# Patient Record
Sex: Male | Born: 1945 | Race: White | Hispanic: No | Marital: Married | Smoking: Former smoker
Health system: Southern US, Community
[De-identification: ages and names within clinical notes are randomized; demographics above are authoritative.]

## PROBLEM LIST (undated history)

## (undated) DIAGNOSIS — K269 Duodenal ulcer, unspecified as acute or chronic, without hemorrhage or perforation: Secondary | ICD-10-CM

## (undated) DIAGNOSIS — M1990 Unspecified osteoarthritis, unspecified site: Secondary | ICD-10-CM

## (undated) DIAGNOSIS — E785 Hyperlipidemia, unspecified: Secondary | ICD-10-CM

## (undated) DIAGNOSIS — M5030 Other cervical disc degeneration, unspecified cervical region: Secondary | ICD-10-CM

## (undated) DIAGNOSIS — Z87891 Personal history of nicotine dependence: Secondary | ICD-10-CM

## (undated) DIAGNOSIS — N400 Enlarged prostate without lower urinary tract symptoms: Secondary | ICD-10-CM

## (undated) DIAGNOSIS — L570 Actinic keratosis: Secondary | ICD-10-CM

## (undated) DIAGNOSIS — R011 Cardiac murmur, unspecified: Secondary | ICD-10-CM

## (undated) DIAGNOSIS — R012 Other cardiac sounds: Secondary | ICD-10-CM

## (undated) DIAGNOSIS — E119 Type 2 diabetes mellitus without complications: Secondary | ICD-10-CM

## (undated) HISTORY — PX: CARDIAC CATHETERIZATION: SHX172

## (undated) HISTORY — PX: OTHER SURGICAL HISTORY: SHX169

## (undated) HISTORY — DX: Personal history of nicotine dependence: Z87.891

## (undated) HISTORY — PX: PILONIDAL CYST EXCISION: SHX744

## (undated) HISTORY — DX: Actinic keratosis: L57.0

---

## 2005-03-09 ENCOUNTER — Ambulatory Visit: Payer: Self-pay | Admitting: Unknown Physician Specialty

## 2005-03-23 ENCOUNTER — Ambulatory Visit: Payer: Self-pay | Admitting: Unknown Physician Specialty

## 2005-04-01 ENCOUNTER — Ambulatory Visit: Payer: Self-pay | Admitting: Unknown Physician Specialty

## 2010-05-21 ENCOUNTER — Ambulatory Visit: Payer: Self-pay | Admitting: Orthopedic Surgery

## 2013-12-06 ENCOUNTER — Ambulatory Visit: Payer: Self-pay | Admitting: Internal Medicine

## 2013-12-06 LAB — CBC CANCER CENTER
BASOS PCT: 1.4 %
Basophil #: 0.1 x10 3/mm (ref 0.0–0.1)
EOS ABS: 0.3 x10 3/mm (ref 0.0–0.7)
EOS PCT: 4.6 %
HCT: 45 % (ref 40.0–52.0)
HGB: 14.6 g/dL (ref 13.0–18.0)
Lymphocyte #: 1.5 x10 3/mm (ref 1.0–3.6)
Lymphocyte %: 20.7 %
MCH: 29.5 pg (ref 26.0–34.0)
MCHC: 32.3 g/dL (ref 32.0–36.0)
MCV: 91 fL (ref 80–100)
MONO ABS: 0.7 x10 3/mm (ref 0.2–1.0)
MONOS PCT: 10.2 %
NEUTROS PCT: 63.1 %
Neutrophil #: 4.5 x10 3/mm (ref 1.4–6.5)
PLATELETS: 215 x10 3/mm (ref 150–440)
RBC: 4.94 10*6/uL (ref 4.40–5.90)
RDW: 13.6 % (ref 11.5–14.5)
WBC: 7.2 x10 3/mm (ref 3.8–10.6)

## 2013-12-06 LAB — IRON AND TIBC
IRON BIND. CAP.(TOTAL): 324 ug/dL (ref 250–450)
Iron Saturation: 27 %
Iron: 87 ug/dL (ref 65–175)
UNBOUND IRON-BIND. CAP.: 237 ug/dL

## 2013-12-06 LAB — FERRITIN: Ferritin (ARMC): 41 ng/mL (ref 8–388)

## 2013-12-12 LAB — CANCER CENTER HEMATOCRIT: HCT: 46.1 % (ref 40.0–52.0)

## 2013-12-28 ENCOUNTER — Ambulatory Visit: Payer: Self-pay | Admitting: Internal Medicine

## 2014-01-17 ENCOUNTER — Ambulatory Visit: Payer: Self-pay | Admitting: Family Medicine

## 2015-01-23 ENCOUNTER — Telehealth: Payer: Self-pay

## 2015-01-23 NOTE — Telephone Encounter (Signed)
Navigator Encounter Type: Telephone, Screening  Notified patient that annual lung cancer screening low dose CT scan is due. Confirmed that patient is within the age range 82-77 and asymptomatic. The patient quit smoking ten years ago. Shared Decision Making Visit was done on 01/17/2014. Patient is agreeable to scheduling of CT scan.

## 2015-02-09 ENCOUNTER — Encounter: Payer: Self-pay | Admitting: Family Medicine

## 2015-02-09 ENCOUNTER — Telehealth: Payer: Self-pay | Admitting: *Deleted

## 2015-02-09 ENCOUNTER — Other Ambulatory Visit: Payer: Self-pay | Admitting: Family Medicine

## 2015-02-09 DIAGNOSIS — Z87891 Personal history of nicotine dependence: Secondary | ICD-10-CM | POA: Insufficient documentation

## 2015-02-09 HISTORY — DX: Personal history of nicotine dependence: Z87.891

## 2015-02-09 NOTE — Telephone Encounter (Deleted)
Pt called and saw on my chart that he had ct screening order for 9/16 and would like to know why he is been sch. For it.  I have tried to get in touch with Marcie Bal who entered a note about this 8/26 and Shawn and so far I have found out that Marcie Bal has left for the day and Shawn not been able to be reached.  I did call pt and explain to him that because he either was a current smoker of many years or he was a smoker for many years.  He states that he quit 10 years ago and again I explained it that he meets the criteria for the screening.  He asked about risks of having scans year after year and I did tell him it is low dose of exposure and I told him that I will have one of the navaigators call him tom. To discuss it but he prefers to cancel it. He is willing to wait til call back tom.

## 2015-02-09 NOTE — Telephone Encounter (Signed)
Pt called and saw on my chart that he had ct screening order for 9/16 and would like to know why he is been sch. For it.  I have tried to get in touch with Marcie Bal who entered a note about this 8/26 and Shawn and so far I have found out that Marcie Bal has left for the day and Shawn not been able to be reached.  I did call pt and explain to him that because he either was a current smoker of many years or he was a smoker for many years.  He states that he quit 10 years ago and again I explained it that he meets the criteria for the screening.  He asked about risks of having scans year after year and I did tell him it is low dose of exposure and I told him that I will have one of the navaigators call him tom. To discuss it but he prefers to cancel it. He is willing to wait til call back tom.

## 2015-02-12 ENCOUNTER — Telehealth: Payer: Self-pay

## 2015-02-12 NOTE — Telephone Encounter (Signed)
Telephone call to patient 02/10/15. Patient stated he no longer wants to participate in CT screening.

## 2015-02-13 ENCOUNTER — Ambulatory Visit: Payer: Medicare Other

## 2017-02-01 ENCOUNTER — Encounter: Payer: Self-pay | Admitting: Anesthesiology

## 2017-02-01 ENCOUNTER — Ambulatory Visit
Admission: RE | Admit: 2017-02-01 | Discharge: 2017-02-01 | Disposition: A | Discharge status: Home / Self Care | Payer: Medicare Other | Source: Ambulatory Visit | Attending: Unknown Physician Specialty | Admitting: Unknown Physician Specialty

## 2017-02-01 ENCOUNTER — Ambulatory Visit: Payer: Medicare Other | Admitting: Anesthesiology

## 2017-02-01 ENCOUNTER — Encounter
Admission: RE | Disposition: A | Discharge status: Home / Self Care | Payer: Self-pay | Source: Ambulatory Visit | Attending: Unknown Physician Specialty

## 2017-02-01 DIAGNOSIS — Z8719 Personal history of other diseases of the digestive system: Secondary | ICD-10-CM | POA: Insufficient documentation

## 2017-02-01 DIAGNOSIS — K635 Polyp of colon: Secondary | ICD-10-CM | POA: Diagnosis not present

## 2017-02-01 DIAGNOSIS — Z79899 Other long term (current) drug therapy: Secondary | ICD-10-CM | POA: Insufficient documentation

## 2017-02-01 DIAGNOSIS — K640 First degree hemorrhoids: Secondary | ICD-10-CM | POA: Diagnosis not present

## 2017-02-01 DIAGNOSIS — Z8711 Personal history of peptic ulcer disease: Secondary | ICD-10-CM | POA: Diagnosis not present

## 2017-02-01 DIAGNOSIS — I10 Essential (primary) hypertension: Secondary | ICD-10-CM | POA: Insufficient documentation

## 2017-02-01 DIAGNOSIS — E785 Hyperlipidemia, unspecified: Secondary | ICD-10-CM | POA: Insufficient documentation

## 2017-02-01 DIAGNOSIS — E119 Type 2 diabetes mellitus without complications: Secondary | ICD-10-CM | POA: Insufficient documentation

## 2017-02-01 DIAGNOSIS — Z7982 Long term (current) use of aspirin: Secondary | ICD-10-CM | POA: Diagnosis not present

## 2017-02-01 DIAGNOSIS — Z794 Long term (current) use of insulin: Secondary | ICD-10-CM | POA: Diagnosis not present

## 2017-02-01 DIAGNOSIS — N400 Enlarged prostate without lower urinary tract symptoms: Secondary | ICD-10-CM | POA: Diagnosis not present

## 2017-02-01 DIAGNOSIS — M1990 Unspecified osteoarthritis, unspecified site: Secondary | ICD-10-CM | POA: Insufficient documentation

## 2017-02-01 DIAGNOSIS — Z1211 Encounter for screening for malignant neoplasm of colon: Secondary | ICD-10-CM | POA: Diagnosis not present

## 2017-02-01 DIAGNOSIS — Z87891 Personal history of nicotine dependence: Secondary | ICD-10-CM | POA: Insufficient documentation

## 2017-02-01 HISTORY — DX: Unspecified osteoarthritis, unspecified site: M19.90

## 2017-02-01 HISTORY — PX: COLONOSCOPY WITH PROPOFOL: SHX5780

## 2017-02-01 HISTORY — DX: Hyperlipidemia, unspecified: E78.5

## 2017-02-01 HISTORY — DX: Type 2 diabetes mellitus without complications: E11.9

## 2017-02-01 HISTORY — DX: Duodenal ulcer, unspecified as acute or chronic, without hemorrhage or perforation: K26.9

## 2017-02-01 HISTORY — DX: Benign prostatic hyperplasia without lower urinary tract symptoms: N40.0

## 2017-02-01 LAB — GLUCOSE, CAPILLARY: GLUCOSE-CAPILLARY: 85 mg/dL (ref 65–99)

## 2017-02-01 SURGERY — COLONOSCOPY WITH PROPOFOL
Anesthesia: General

## 2017-02-01 MED ORDER — SODIUM CHLORIDE 0.9 % IV SOLN
INTRAVENOUS | Status: DC
  Administered 2017-02-01: 1000 mL via INTRAVENOUS

## 2017-02-01 MED ORDER — PROPOFOL 500 MG/50ML IV EMUL
INTRAVENOUS | Status: DC | PRN
  Administered 2017-02-01: 100 ug/kg/min via INTRAVENOUS

## 2017-02-01 MED ORDER — PROPOFOL 10 MG/ML IV BOLUS
INTRAVENOUS | Status: DC | PRN
Start: 2017-02-01 — End: 2017-02-01
  Administered 2017-02-01: 20 mg via INTRAVENOUS
  Administered 2017-02-01: 50 mg via INTRAVENOUS

## 2017-02-01 MED ORDER — SUCCINYLCHOLINE CHLORIDE 20 MG/ML IJ SOLN
INTRAMUSCULAR | Status: AC
  Filled 2017-02-01: qty 1

## 2017-02-01 MED ORDER — PROPOFOL 500 MG/50ML IV EMUL
INTRAVENOUS | Status: AC
  Filled 2017-02-01: qty 50

## 2017-02-01 MED ORDER — LIDOCAINE HCL (PF) 2 % IJ SOLN
INTRAMUSCULAR | Status: AC
  Filled 2017-02-01: qty 2

## 2017-02-01 MED ORDER — EPHEDRINE SULFATE 50 MG/ML IJ SOLN
INTRAMUSCULAR | Status: AC
  Filled 2017-02-01: qty 1

## 2017-02-01 MED ORDER — PHENYLEPHRINE HCL 10 MG/ML IJ SOLN
INTRAMUSCULAR | Status: AC
  Filled 2017-02-01: qty 1

## 2017-02-01 NOTE — Anesthesia Preprocedure Evaluation (Signed)
Anesthesia Evaluation  Patient identified by MRN, date of birth, ID band Patient awake    Reviewed: Allergy & Precautions, NPO status , Patient's Chart, lab work & pertinent test results  Airway Mallampati: II  TM Distance: >3 FB     Dental   Pulmonary former smoker,    Pulmonary exam normal        Cardiovascular hypertension, Normal cardiovascular exam     Neuro/Psych negative neurological ROS  negative psych ROS   GI/Hepatic Neg liver ROS, PUD,   Endo/Other  diabetes, Type 2, Oral Hypoglycemic Agents  Renal/GU negative Renal ROS  negative genitourinary   Musculoskeletal  (+) Arthritis , Osteoarthritis,    Abdominal Normal abdominal exam  (+)   Peds negative pediatric ROS (+)  Hematology negative hematology ROS (+)   Anesthesia Other Findings   Reproductive/Obstetrics                             Anesthesia Physical Anesthesia Plan  ASA: II  Anesthesia Plan: General   Post-op Pain Management:    Induction: Intravenous  PONV Risk Score and Plan:   Airway Management Planned: Nasal Cannula  Additional Equipment:   Intra-op Plan:   Post-operative Plan:   Informed Consent: I have reviewed the patients History and Physical, chart, labs and discussed the procedure including the risks, benefits and alternatives for the proposed anesthesia with the patient or authorized representative who has indicated his/her understanding and acceptance.   Dental advisory given  Plan Discussed with: CRNA and Surgeon  Anesthesia Plan Comments:         Anesthesia Quick Evaluation

## 2017-02-01 NOTE — Transfer of Care (Signed)
Immediate Anesthesia Transfer of Care Note  Patient: Bryan Navarro  Procedure(s) Performed: Procedure(s): COLONOSCOPY WITH PROPOFOL (N/A)  Patient Location: PACU  Anesthesia Type:General  Level of Consciousness: awake  Airway & Oxygen Therapy: Patient connected to nasal cannula oxygen  Post-op Assessment: Report given to RN  Post vital signs: stable  Last Vitals:  Vitals:   02/01/17 0647  BP: 137/80  Pulse: 70  Resp: 16  Temp: (!) 35.8 C  SpO2: 97%    Last Pain:  Vitals:   02/01/17 0647  TempSrc: Tympanic         Complications: No apparent anesthesia complications

## 2017-02-01 NOTE — Anesthesia Post-op Follow-up Note (Signed)
Anesthesia QCDR form completed.        

## 2017-02-01 NOTE — H&P (Signed)
Primary Care Physician:  Idelle Crouch, MD Primary Gastroenterologist:  Dr. Vira Agar  Pre-Procedure History & Physical: HPI:  Bryan Navarro is a 71 y.o. male is here for an colonoscopy.   Past Medical History:  Diagnosis Date  . Arthritis    osteoarthritis  . BPH (benign prostatic hyperplasia)   . Diabetes mellitus without complication ([place])   . Duodenal ulcer   . Hyperlipidemia   . Personal history of tobacco use, presenting hazards to health 02/09/2015    Past Surgical History:  Procedure Laterality Date  . CARDIAC CATHETERIZATION    . neck lipoma removal    . PILONIDAL CYST EXCISION      Prior to Admission medications   Medication Sig Start Date End Date Taking? Authorizing Provider  aspirin EC 81 MG tablet Take 81 mg by mouth daily.   Yes [provider]  canagliflozin (INVOKANA) 300 MG TABS tablet Take 300 mg by mouth daily before breakfast.   Yes [provider]  clotrimazole-betamethasone (LOTRISONE) cream Apply 1 application topically 2 (two) times daily.   Yes [provider]  doxazosin (CARDURA) 2 MG tablet Take 2 mg by mouth daily.   Yes [provider]  insulin glargine (LANTUS) 100 UNIT/ML injection Inject 30 Units into the skin at bedtime.   Yes [provider]  metFORMIN (GLUCOPHAGE) 1000 MG tablet Take 1,000 mg by mouth 2 (two) times daily with a meal.   Yes [provider]  ramipril (ALTACE) 2.5 MG capsule Take 2.5 mg by mouth daily.   Yes [provider]  simvastatin (ZOCOR) 40 MG tablet Take 40 mg by mouth daily.   Yes [provider]  sitaGLIPtin (JANUVIA) 100 MG tablet Take 100 mg by mouth daily.   Yes [provider]  tiZANidine (ZANAFLEX) 4 MG capsule Take 4 mg by mouth 3 (three) times daily as needed for muscle spasms.   Yes [provider]    Allergies as of 11/09/2016  . (Not on File)    History reviewed. No pertinent family history.  Social History    Social History  . Marital status: Married    Spouse name: N/A  . Number of children: N/A  . Years of education: N/A   Occupational History  . Not on file.   Social History Main Topics  . Smoking status: Former Research scientist (life sciences)  . Smokeless tobacco: Never Used  . Alcohol use Not on file  . Drug use: Unknown  . Sexual activity: Not on file   Other Topics Concern  . Not on file   Social History Narrative  . No narrative on file    Review of Systems: See HPI, otherwise negative ROS  Physical Exam: BP 137/80   Pulse 70   Temp (!) 96.5 F (35.8 C) (Tympanic)   Resp 16   Ht 5\' 8"  (1.727 m)   Wt 83.9 kg (185 lb)   SpO2 97%   BMI 28.13 kg/m  General:   Alert,  pleasant and cooperative in NAD Head:  Normocephalic and atraumatic. Neck:  Supple; no masses or thyromegaly. Lungs:  Clear throughout to auscultation.    Heart:  Regular rate and rhythm. Abdomen:  Soft, nontender and nondistended. Normal bowel sounds, without guarding, and without rebound.   Neurologic:  Alert and  oriented x4;  grossly normal neurologically.  Impression/Plan: Bryan Navarro is here for an colonoscopy to be performed for screening colonoscopy.  Risks, benefits, limitations, and alternatives regarding  colonoscopy have been  reviewed with the patient.  Questions have been answered.  All parties agreeable.   Gaylyn Cheers, MD  02/01/2017, 7:29 AM

## 2017-02-01 NOTE — Anesthesia Postprocedure Evaluation (Signed)
Anesthesia Post Note  Patient: Bryan Navarro  Procedure(s) Performed: Procedure(s) (LRB): COLONOSCOPY WITH PROPOFOL (N/A)  Patient location during evaluation: PACU Anesthesia Type: General Level of consciousness: awake and alert and oriented Pain management: pain level controlled Vital Signs Assessment: post-procedure vital signs reviewed and stable Respiratory status: spontaneous breathing Cardiovascular status: blood pressure returned to baseline Anesthetic complications: no     Last Vitals:  Vitals:   02/01/17 0820 02/01/17 0830  BP: 116/78 137/70  Pulse: (!) 59 (!) 57  Resp: 20 14  Temp:    SpO2: 96% 97%    Last Pain:  Vitals:   02/01/17 0800  TempSrc: Tympanic                 Bryan Navarro

## 2017-02-01 NOTE — Op Note (Signed)
[facility] Gastroenterology Patient Name: Bryan Navarro Procedure Date: 02/01/2017 6:56 AM MRN: [id number] Account #: [id number] Date of Birth: [birth year] Admit Type: Outpatient Age: 71 Room: [facility] ENDO ROOM 3 Gender: Male Note Status: Finalized Procedure:            Colonoscopy Indications:          Screening for colorectal malignant neoplasm Providers:            Manya Silvas, MD Referring MD:         Leonie Douglas. Doy Hutching, MD (Referring MD) Medicines:            Propofol per Anesthesia Complications:        No immediate complications. Procedure:            Pre-Anesthesia Assessment:                       - After reviewing the risks and benefits, the patient                        was deemed in satisfactory condition to undergo the                        procedure.                       After obtaining informed consent, the colonoscope was                        passed under direct vision. Throughout the procedure,                        the patient's blood pressure, pulse, and oxygen                        saturations were monitored continuously. The                        Colonoscope was introduced through the anus and                        advanced to the the cecum, identified by appendiceal                        orifice and ileocecal valve. The colonoscopy was                        performed without difficulty. The patient tolerated the                        procedure well. The quality of the bowel preparation                        was good. Findings:      A diminutive polyp was found in the transverse colon. The polyp was       sessile. The polyp was removed with a cold snare. Resection and       retrieval were complete.      A diminutive polyp was found in the descending colon. The polyp was       sessile. The polyp was removed with a jumbo cold forceps. Resection and  retrieval were complete.      Internal hemorrhoids were found during  endoscopy. The hemorrhoids were       small and Grade I (internal hemorrhoids that do not prolapse).      The exam was otherwise without abnormality. Impression:           - One diminutive polyp in the transverse colon, removed                        with a cold snare. Resected and retrieved.                       - One diminutive polyp in the descending colon, removed                        with a jumbo cold forceps. Resected and retrieved.                       - Internal hemorrhoids.                       - The examination was otherwise normal. Recommendation:       - Await pathology results. Manya Silvas, MD 02/01/2017 8:01:05 AM This report has been signed electronically. Number of Addenda: 0 Note Initiated On: 02/01/2017 6:56 AM Scope Withdrawal Time: 0 hours 14 minutes 44 seconds  Total Procedure Duration: 0 hours 20 minutes 13 seconds       [facility]

## 2017-02-02 ENCOUNTER — Encounter: Payer: Self-pay | Admitting: Unknown Physician Specialty

## 2017-02-03 LAB — SURGICAL PATHOLOGY

## 2018-02-15 ENCOUNTER — Other Ambulatory Visit: Payer: Self-pay | Admitting: Internal Medicine

## 2018-02-15 DIAGNOSIS — R911 Solitary pulmonary nodule: Secondary | ICD-10-CM

## 2018-04-04 ENCOUNTER — Ambulatory Visit
Admission: RE | Admit: 2018-04-04 | Discharge: 2018-04-04 | Disposition: A | Discharge status: Home / Self Care | Payer: Medicare Other | Source: Ambulatory Visit | Attending: Internal Medicine | Admitting: Internal Medicine

## 2018-04-04 ENCOUNTER — Ambulatory Visit
Admission: RE | Admit: 2018-04-04 | Discharge: 2018-04-04 | Disposition: A | Discharge status: Home / Self Care | Payer: Medicare Other | Source: Ambulatory Visit | Attending: Nurse Practitioner | Admitting: Nurse Practitioner

## 2018-04-04 ENCOUNTER — Other Ambulatory Visit: Payer: Self-pay | Admitting: *Deleted

## 2018-04-04 ENCOUNTER — Encounter: Payer: Self-pay | Admitting: *Deleted

## 2018-04-04 DIAGNOSIS — Z122 Encounter for screening for malignant neoplasm of respiratory organs: Secondary | ICD-10-CM | POA: Diagnosis present

## 2018-04-04 DIAGNOSIS — I2510 Atherosclerotic heart disease of native coronary artery without angina pectoris: Secondary | ICD-10-CM | POA: Insufficient documentation

## 2018-04-04 DIAGNOSIS — Z87891 Personal history of nicotine dependence: Secondary | ICD-10-CM

## 2018-04-04 DIAGNOSIS — J439 Emphysema, unspecified: Secondary | ICD-10-CM | POA: Insufficient documentation

## 2018-04-04 DIAGNOSIS — R911 Solitary pulmonary nodule: Secondary | ICD-10-CM

## 2018-06-10 ENCOUNTER — Emergency Department
Admission: EM | Admit: 2018-06-10 | Discharge: 2018-06-10 | Disposition: A | Discharge status: Home / Self Care | Payer: Medicare Other | Attending: Emergency Medicine | Admitting: Emergency Medicine

## 2018-06-10 ENCOUNTER — Encounter: Payer: Self-pay | Admitting: Emergency Medicine

## 2018-06-10 ENCOUNTER — Other Ambulatory Visit: Payer: Self-pay

## 2018-06-10 ENCOUNTER — Emergency Department: Payer: Medicare Other

## 2018-06-10 DIAGNOSIS — R319 Hematuria, unspecified: Secondary | ICD-10-CM | POA: Diagnosis not present

## 2018-06-10 DIAGNOSIS — R350 Frequency of micturition: Secondary | ICD-10-CM | POA: Diagnosis present

## 2018-06-10 DIAGNOSIS — Z7982 Long term (current) use of aspirin: Secondary | ICD-10-CM | POA: Insufficient documentation

## 2018-06-10 DIAGNOSIS — E119 Type 2 diabetes mellitus without complications: Secondary | ICD-10-CM | POA: Diagnosis not present

## 2018-06-10 DIAGNOSIS — Z79899 Other long term (current) drug therapy: Secondary | ICD-10-CM | POA: Diagnosis not present

## 2018-06-10 DIAGNOSIS — N390 Urinary tract infection, site not specified: Secondary | ICD-10-CM | POA: Insufficient documentation

## 2018-06-10 DIAGNOSIS — Z794 Long term (current) use of insulin: Secondary | ICD-10-CM | POA: Insufficient documentation

## 2018-06-10 DIAGNOSIS — Z87891 Personal history of nicotine dependence: Secondary | ICD-10-CM | POA: Diagnosis not present

## 2018-06-10 LAB — CBC WITH DIFFERENTIAL/PLATELET
ABS IMMATURE GRANULOCYTES: 0.24 10*3/uL — AB (ref 0.00–0.07)
Basophils Absolute: 0.1 10*3/uL (ref 0.0–0.1)
Basophils Relative: 0 %
Eosinophils Absolute: 0 10*3/uL (ref 0.0–0.5)
Eosinophils Relative: 0 %
HCT: 45.1 % (ref 39.0–52.0)
Hemoglobin: 14.5 g/dL (ref 13.0–17.0)
Immature Granulocytes: 1 %
Lymphocytes Relative: 3 %
Lymphs Abs: 0.8 10*3/uL (ref 0.7–4.0)
MCH: 29.8 pg (ref 26.0–34.0)
MCHC: 32.2 g/dL (ref 30.0–36.0)
MCV: 92.8 fL (ref 80.0–100.0)
MONO ABS: 2 10*3/uL — AB (ref 0.1–1.0)
Monocytes Relative: 8 %
NEUTROS ABS: 21.7 10*3/uL — AB (ref 1.7–7.7)
Neutrophils Relative %: 88 %
Platelets: 188 10*3/uL (ref 150–400)
RBC: 4.86 MIL/uL (ref 4.22–5.81)
RDW: 13.4 % (ref 11.5–15.5)
WBC: 24.8 10*3/uL — AB (ref 4.0–10.5)
nRBC: 0 % (ref 0.0–0.2)

## 2018-06-10 LAB — URINALYSIS, COMPLETE (UACMP) WITH MICROSCOPIC
Bacteria, UA: NONE SEEN
Bilirubin Urine: NEGATIVE
Ketones, ur: 80 mg/dL — AB
Nitrite: NEGATIVE
PROTEIN: 30 mg/dL — AB
SQUAMOUS EPITHELIAL / LPF: NONE SEEN (ref 0–5)
Specific Gravity, Urine: 1.031 — ABNORMAL HIGH (ref 1.005–1.030)
pH: 5 (ref 5.0–8.0)

## 2018-06-10 LAB — COMPREHENSIVE METABOLIC PANEL
ALT: 19 U/L (ref 0–44)
AST: 17 U/L (ref 15–41)
Albumin: 3.9 g/dL (ref 3.5–5.0)
Alkaline Phosphatase: 60 U/L (ref 38–126)
Anion gap: 10 (ref 5–15)
BUN: 22 mg/dL (ref 8–23)
CHLORIDE: 104 mmol/L (ref 98–111)
CO2: 20 mmol/L — AB (ref 22–32)
Calcium: 9 mg/dL (ref 8.9–10.3)
Creatinine, Ser: 1.01 mg/dL (ref 0.61–1.24)
Glucose, Bld: 139 mg/dL — ABNORMAL HIGH (ref 70–99)
Potassium: 4.1 mmol/L (ref 3.5–5.1)
SODIUM: 134 mmol/L — AB (ref 135–145)
Total Bilirubin: 1.4 mg/dL — ABNORMAL HIGH (ref 0.3–1.2)
Total Protein: 6.9 g/dL (ref 6.5–8.1)

## 2018-06-10 LAB — INFLUENZA PANEL BY PCR (TYPE A & B)
INFLBPCR: NEGATIVE
Influenza A By PCR: NEGATIVE

## 2018-06-10 LAB — GLUCOSE, CAPILLARY: GLUCOSE-CAPILLARY: 134 mg/dL — AB (ref 70–99)

## 2018-06-10 LAB — CG4 I-STAT (LACTIC ACID): Lactic Acid, Venous: 1.45 mmol/L (ref 0.5–1.9)

## 2018-06-10 MED ORDER — SODIUM CHLORIDE 0.9 % IV BOLUS
1000.0000 mL | Freq: Once | INTRAVENOUS | Status: AC
Start: 2018-06-10 — End: 2018-06-10
  Administered 2018-06-10: 1000 mL via INTRAVENOUS

## 2018-06-10 MED ORDER — ONDANSETRON 4 MG PO TBDP: 4.0000 mg | ORAL_TABLET | Freq: Three times a day (TID) | ORAL | 0 refills | Status: DC | PRN

## 2018-06-10 MED ORDER — SODIUM CHLORIDE 0.9 % IV SOLN
1.0000 g | Freq: Once | INTRAVENOUS | Status: AC
  Administered 2018-06-10: 1 g via INTRAVENOUS
  Filled 2018-06-10: qty 10

## 2018-06-10 MED ORDER — SODIUM CHLORIDE 0.9 % IV BOLUS
500.0000 mL | Freq: Once | INTRAVENOUS | Status: AC
  Administered 2018-06-10: 500 mL via INTRAVENOUS

## 2018-06-10 MED ORDER — CEPHALEXIN 500 MG PO CAPS: 500.0000 mg | ORAL_CAPSULE | Freq: Three times a day (TID) | ORAL | 0 refills | Status: DC

## 2018-06-10 MED ORDER — TAMSULOSIN HCL 0.4 MG PO CAPS: 0.4000 mg | ORAL_CAPSULE | Freq: Every day | ORAL | 0 refills | Status: DC

## 2018-06-10 MED ORDER — SODIUM CHLORIDE 0.9 % IV BOLUS: 500.0000 mL | Freq: Once | INTRAVENOUS | Status: DC

## 2018-06-10 NOTE — ED Notes (Signed)
See triage note  Presents with urinary freq since Friday  Unsure of fever at home but has had chills and felt warm  Low grade fever noted on arrival

## 2018-06-10 NOTE — Discharge Instructions (Signed)
Follow-up with your regular doctor tomorrow for a recheck.  Return to emergency department if worsening.  Drink plenty of fluids.  Eat a regular diet.  Take the medications as prescribed.

## 2018-06-10 NOTE — ED Provider Notes (Signed)
[facility] Emergency Department Provider Note  ____________________________________________   First MD Initiated Contact with Patient 06/10/18 1006     (approximate)  I have reviewed the triage vital signs and the nursing notes.   HISTORY  Chief Complaint Urinary Frequency    HPI Bryan Navarro is a 73 y.o. male presents emergency department complaining of urinary frequency since Friday.  Has history of BPH.  He states that he has been urinating very little amounts but very frequently.  Some burning with urination.  He states that he has had a fever and chills which started Friday.  He denies any chest pain/shortness of breath/abdominal pain.  Denies vomiting or diarrhea.    Past Medical History:  Diagnosis Date  . Arthritis    osteoarthritis  . BPH (benign prostatic hyperplasia)   . Diabetes mellitus without complication ([place])   . Duodenal ulcer   . Hyperlipidemia   . Personal history of tobacco use, presenting hazards to health 02/09/2015    Patient Active Problem List   Diagnosis Date Noted  . Personal history of tobacco use, presenting hazards to health 02/09/2015    Past Surgical History:  Procedure Laterality Date  . CARDIAC CATHETERIZATION    . COLONOSCOPY WITH PROPOFOL N/A 02/01/2017   Procedure: COLONOSCOPY WITH PROPOFOL;  Surgeon: Manya Silvas, MD;  Location: [facility] ENDOSCOPY;  Service: Endoscopy;  Laterality: N/A;  . neck lipoma removal    . PILONIDAL CYST EXCISION      Prior to Admission medications   Medication Sig Start Date End Date Taking? Authorizing Provider  doxazosin (CARDURA) 2 MG tablet Take 2 mg by mouth daily.   Yes [provider]  aspirin EC 81 MG tablet Take 81 mg by mouth daily.    [provider]  canagliflozin (INVOKANA) 300 MG TABS tablet Take 300 mg by mouth daily before breakfast.    [provider]  cephALEXin (KEFLEX) 500 MG capsule Take 1 capsule (500 mg total) by mouth 3  (three) times daily. 06/10/18   Cristol Engdahl, Linden Dolin, PA-C  insulin glargine (LANTUS) 100 UNIT/ML injection Inject 30 Units into the skin at bedtime.    [provider]  metFORMIN (GLUCOPHAGE) 1000 MG tablet Take 1,000 mg by mouth 2 (two) times daily with a meal.    [provider]  ondansetron (ZOFRAN-ODT) 4 MG disintegrating tablet Take 1 tablet (4 mg total) by mouth every 8 (eight) hours as needed. 06/10/18   Kamaile Zachow, Linden Dolin, PA-C  ramipril (ALTACE) 2.5 MG capsule Take 2.5 mg by mouth daily.    [provider]  simvastatin (ZOCOR) 40 MG tablet Take 40 mg by mouth daily.    [provider]  sitaGLIPtin (JANUVIA) 100 MG tablet Take 100 mg by mouth daily.    [provider]  tamsulosin (FLOMAX) 0.4 MG CAPS capsule Take 1 capsule (0.4 mg total) by mouth daily. 06/10/18   Donnie Panik, Linden Dolin, PA-C    Allergies Venomil honey bee venom [honey bee venom]  No family history on file.  Social History Social History   Tobacco Use  . Smoking status: Former Research scientist (life sciences)  . Smokeless tobacco: Never Used  Substance Use Topics  . Alcohol use: Not on file  . Drug use: Not on file    Review of Systems  Constitutional: Positive fever/chills Eyes: No visual changes. ENT: No sore throat. Respiratory: Denies cough Genitourinary: Positive for dysuria. Musculoskeletal: Negative for back pain. Skin: Negative for rash.    ____________________________________________  PHYSICAL EXAM:  VITAL SIGNS: ED Triage Vitals  Enc Vitals Group     BP 06/10/18 0956 (!) 119/51     Pulse Rate 06/10/18 0956 98     Resp 06/10/18 0956 18     Temp 06/10/18 0956 99.1 F (37.3 C)     Temp Source 06/10/18 0956 Oral     SpO2 06/10/18 0956 96 %     Weight 06/10/18 0951 185 lb (83.9 kg)     Height 06/10/18 0951 5\' 8"  (1.727 m)     Head Circumference --      Peak Flow --      Pain Score 06/10/18 0951 3     Pain Loc --      Pain Edu? --      Excl. in [place]? --     Constitutional:  Alert and oriented. Well appearing and in no acute distress. Eyes: Conjunctivae are normal.  Head: Atraumatic. Nose: No congestion/rhinnorhea. Mouth/Throat: Mucous membranes are moist.   Neck:  supple no lymphadenopathy noted Cardiovascular: Normal rate, regular rhythm. Heart sounds are normal Respiratory: Normal respiratory effort.  No retractions, lungs c t a  Abd: soft nontender bs normal all 4 quad, no CVA tenderness GU: deferred Musculoskeletal: FROM all extremities, warm and well perfused Neurologic:  Normal speech and language.  Skin:  Skin is warm, dry and intact. No rash noted. Psychiatric: Mood and affect are normal. Speech and behavior are normal.  ____________________________________________   LABS (all labs ordered are listed, but only abnormal results are displayed)  Labs Reviewed  URINALYSIS, COMPLETE (UACMP) WITH MICROSCOPIC - Abnormal; Notable for the following components:      Result Value   Color, Urine YELLOW (*)    APPearance HAZY (*)    Specific Gravity, Urine 1.031 (*)    Glucose, UA >=500 (*)    Hgb urine dipstick MODERATE (*)    Ketones, ur 80 (*)    Protein, ur 30 (*)    Leukocytes, UA TRACE (*)    All other components within normal limits  GLUCOSE, CAPILLARY - Abnormal; Notable for the following components:   Glucose-Capillary 134 (*)    All other components within normal limits  CBC WITH DIFFERENTIAL/PLATELET - Abnormal; Notable for the following components:   WBC 24.8 (*)    Neutro Abs 21.7 (*)    Monocytes Absolute 2.0 (*)    Abs Immature Granulocytes 0.24 (*)    All other components within normal limits  COMPREHENSIVE METABOLIC PANEL - Abnormal; Notable for the following components:   Sodium 134 (*)    CO2 20 (*)    Glucose, Bld 139 (*)    Total Bilirubin 1.4 (*)    All other components within normal limits  URINE CULTURE  CULTURE, BLOOD (ROUTINE X 2)  CULTURE, BLOOD (ROUTINE X 2)  INFLUENZA PANEL BY PCR (TYPE A & B)  CBG MONITORING,  ED  I-STAT CG4 LACTIC ACID, ED  CG4 I-STAT (LACTIC ACID)   ____________________________________________   ____________________________________________  RADIOLOGY  Chest x-ray is negative for pneumonia CT renal stone study does not show any stones or hydronephrosis  ____________________________________________   PROCEDURES  Procedure(s) performed: Saline lock, normal saline 1 L IV, Rocephin 1 g IV, repeat normal saline 500 mg IV  Procedures    ____________________________________________   INITIAL IMPRESSION / ASSESSMENT AND PLAN / ED COURSE  Pertinent labs & imaging results that were available during my care of the patient were reviewed by me and considered in my medical  decision making (see chart for details).   Patient 73 year old male presents emergency department with dysuria.  He states he has had fever and chills for 2 days.  Decreased appetite.  States did not take his medication for 2 days.  Physical exam the patient appears to be nontoxic.  He has low-grade temp.  Lungs are clear to auscultation, heart sounds are normal, abdomen is not tender.  No CVA tenderness.  Marland Kitchen   UA shows greater than 500 glucose, 80 ketones, trace of leuks, CBC with elevated WBC at 74.1, metabolic panel is basically normal, i-STAT lactic acid is normal, influenza test is negative, urine culture ordered  Chest x-ray is negative for pneumonia CT scan renal study is negative for renal stone or hydronephrosis.  Dr. Burlene Arnt in to see the patient.  He agrees that the patient looks well.  The patient's been able to walk back and forth to the bathroom several times without any difficulty.  His vitals are improved from when he first arrived in the hospital.  He is willing to follow-up with his regular doctor tomorrow for recheck.  He will be started on Keflex and Zofran if needed.  He is to drink plenty of fluids.  Eat a regular diet.  Strict instructions to return to the emergency department if he is  worsening.  He and his wife state they understand.  He was discharged in stable condition.    As part of my medical decision making, I reviewed the following data within the [place] History obtained from family, Nursing notes reviewed and incorporated, Labs reviewed CBC with elevated WBC at 24.8, with remainder the labs are basically unremarkable except for the urine has 80 ketones and trace of leuks, Old chart reviewed, Radiograph reviewed chest x-ray is negative for pneumonia, CT renal stone study is negative for stone or hydronephrosis, Evaluated by EM attending Dr. Burlene Arnt, Notes from prior ED visits and [place] Controlled Substance Database  ____________________________________________   FINAL CLINICAL IMPRESSION(S) / ED DIAGNOSES  Final diagnoses:  Urinary tract infection with hematuria, site unspecified      NEW MEDICATIONS STARTED DURING THIS VISIT:  New Prescriptions   CEPHALEXIN (KEFLEX) 500 MG CAPSULE    Take 1 capsule (500 mg total) by mouth 3 (three) times daily.   ONDANSETRON (ZOFRAN-ODT) 4 MG DISINTEGRATING TABLET    Take 1 tablet (4 mg total) by mouth every 8 (eight) hours as needed.   TAMSULOSIN (FLOMAX) 0.4 MG CAPS CAPSULE    Take 1 capsule (0.4 mg total) by mouth daily.     Note:  This document was prepared using Dragon voice recognition software and may include unintentional dictation errors.    Versie Starks, PA-C 06/10/18 1447    Schuyler Amor, MD 06/10/18 [phone]

## 2018-06-10 NOTE — ED Notes (Signed)
Lunch tray ordered 

## 2018-06-10 NOTE — ED Notes (Signed)
Sent BCx2 to lab.

## 2018-06-10 NOTE — ED Notes (Signed)
Patient transported to X-ray 

## 2018-06-10 NOTE — ED Notes (Signed)
DC instructions discussed, encouraged pt to follow up with PCP this week, informed pt to pick up prescriptions from pharmacy. Pt verbalized understanding of this.

## 2018-06-10 NOTE — ED Notes (Signed)
Patient transported to CT 

## 2018-06-10 NOTE — ED Provider Notes (Signed)
-----------------------------------------   12:50 PM on 06/10/2018 -----------------------------------------  Did directly see patient, he has been feeling unwell for the last 2 days, with urinary frequency and dysuria.  Blood sugar is not markedly elevated.  He has evidence however of urinary tract infection.  We are giving him Rocephin.  There is no antecedent culture to suggest the need for different antibiotics but we have cultured him this time.  Went into the room he is awake and alert looking quite robust actually sitting on the side of the chair next to his wife eating a sandwich.  He certainly does not appear to be toxic.  He is awake and alert, his abdomen is benign.  He does have some left flank pain.  No known history of kidney stones.  I did suggest that we do a renal colic CT scan as an infected kidney stone given his flank pain could be causing him some of his symptoms and could potentially make him prone to decompensation.  We are also giving him hydration as he is had minimal to eat or drink recently.  He actually looks much better to the nurse practitioner who saw him and to me he looks quite well.  His strong preference would be to go home if possible.  My largest area of concern for this patient is his diabetes and his white count.  Obviously though a white count on his own is not sufficient to mandate admission to the hospital patient whom we have given IV antibiotics and to looks otherwise okay.  If CT is negative it is my hope that we possibly can get him home.  We will reassess   Schuyler Amor, MD 06/10/18 1252

## 2018-06-10 NOTE — ED Triage Notes (Signed)
Urinary frequency / dysuria 2-3 day

## 2018-06-12 LAB — URINE CULTURE

## 2018-06-15 LAB — CULTURE, BLOOD (ROUTINE X 2)
CULTURE: NO GROWTH
Culture: NO GROWTH
SPECIAL REQUESTS: ADEQUATE
Special Requests: ADEQUATE

## 2018-07-03 DIAGNOSIS — E118 Type 2 diabetes mellitus with unspecified complications: Secondary | ICD-10-CM | POA: Insufficient documentation

## 2018-07-11 ENCOUNTER — Ambulatory Visit: Payer: Medicare Other | Admitting: Urology

## 2018-07-11 ENCOUNTER — Encounter

## 2018-07-11 ENCOUNTER — Encounter: Payer: Self-pay | Admitting: Urology

## 2018-07-11 VITALS — BP 103/66 | HR 106 | Ht 68.0 in | Wt 182.0 lb

## 2018-07-11 DIAGNOSIS — N400 Enlarged prostate without lower urinary tract symptoms: Secondary | ICD-10-CM

## 2018-07-11 DIAGNOSIS — N138 Other obstructive and reflux uropathy: Secondary | ICD-10-CM | POA: Diagnosis not present

## 2018-07-11 DIAGNOSIS — N401 Enlarged prostate with lower urinary tract symptoms: Secondary | ICD-10-CM

## 2018-07-11 LAB — URINALYSIS, COMPLETE
Bilirubin, UA: NEGATIVE
Ketones, UA: NEGATIVE
Leukocytes, UA: NEGATIVE
Nitrite, UA: NEGATIVE
Protein, UA: NEGATIVE
Specific Gravity, UA: 1.02 (ref 1.005–1.030)
Urobilinogen, Ur: 0.2 mg/dL (ref 0.2–1.0)
pH, UA: 5 (ref 5.0–7.5)

## 2018-07-11 LAB — BLADDER SCAN AMB NON-IMAGING: Scan Result: 93

## 2018-07-11 LAB — MICROSCOPIC EXAMINATION
Bacteria, UA: NONE SEEN
Epithelial Cells (non renal): NONE SEEN /hpf (ref 0–10)
WBC, UA: NONE SEEN /hpf (ref 0–5)

## 2018-07-11 MED ORDER — SULFAMETHOXAZOLE-TRIMETHOPRIM 800-160 MG PO TABS: 1.0000 | ORAL_TABLET | Freq: Two times a day (BID) | ORAL | 0 refills | Status: DC

## 2018-07-11 MED ORDER — NYSTATIN-TRIAMCINOLONE 100000-0.1 UNIT/GM-% EX OINT: 1.0000 "application " | TOPICAL_OINTMENT | Freq: Two times a day (BID) | CUTANEOUS | 0 refills | Status: DC

## 2018-07-11 NOTE — Progress Notes (Signed)
07/11/2018 10:34 AM   Bryan Navarro 05-Jul-1945 [id number]  Referring provider: Idelle Crouch, MD [place], [place] [address]  CC: Urinary frequency  HPI: I saw Bryan Navarro in urology clinic in consultation for urinary frequency from Dr. Doy Hutching.  Is a 73 year old male with diabetes who presented to the ED 06/08/2018 with acute onset of fever 101, chills, pelvic pain, dysuria, and urinary frequency.  Urine culture at that time showed multiple species, he had a leukocytosis to 25K, and he ultimately improved with 10-day course of antibiotics.  CT abdomen pelvis without contrast showed no hydronephrosis, nephrolithiasis, or other concerning findings.  Prostate measured 58 cc on CT scan.  He was discharged with presumed diagnosis of prostatitis.  Prior to this episode, he denies any significant urinary symptoms.  He represented to his PCP on 07/09/2018 with recurrence of his same symptoms with significant urinary frequency and dysuria.  He was started on doxycycline at this time.  He notes he has not had any improvement in his symptoms thus far.  His primary complaint is ongoing severe urinary frequency every 30 minutes.  He denies any current fevers or chills.  There are no aggravating or alleviating factors.  Severity is moderate to severe.  He denies any gross hematuria, or flank pain.  No prior abdominal surgeries.  70-pack-year smoking history, quit 5 years ago.  PSA 06/2018: 2.43  Urinalysis today 0 WBCs, 0 RBCs, no bacteria, nitrite negative.  IPSS score today is 28, with quality of life terrible.  PVR is 90 cc.   PMH: Past Medical History:  Diagnosis Date  . Arthritis    osteoarthritis  . BPH (benign prostatic hyperplasia)   . Diabetes mellitus without complication ([place])   . Duodenal ulcer   . Hyperlipidemia   . Personal history of tobacco use, presenting hazards to health 02/09/2015    Surgical History: Past Surgical History:    Procedure Laterality Date  . CARDIAC CATHETERIZATION    . COLONOSCOPY WITH PROPOFOL N/A 02/01/2017   Procedure: COLONOSCOPY WITH PROPOFOL;  Surgeon: Manya Silvas, MD;  Location: [facility] ENDOSCOPY;  Service: Endoscopy;  Laterality: N/A;  . neck lipoma removal    . PILONIDAL CYST EXCISION      Allergies:  Allergies  Allergen Reactions  . Venomil Honey Bee Venom [Honey Bee Venom]     Family History: No family history on file.  Social History:  reports that he has quit smoking. He has never used smokeless tobacco. No history on file for alcohol and drug.  ROS: Please see flowsheet from today's date for complete review of systems.  Physical Exam: BP 103/66   Pulse (!) 106   Ht 5\' 8"  (1.727 m)   Wt 182 lb (82.6 kg)   BMI 27.67 kg/m    Constitutional:  Alert and oriented, No acute distress.  Well-appearing Cardiovascular: No clubbing, cyanosis, or edema. Respiratory: Normal respiratory effort, no increased work of breathing. GI: Abdomen is soft, nontender, nondistended, no abdominal masses GU: No CVA tenderness, uncircumcised phallus with erythema around the glans and foreskin, no perineal pain to examination DRE: Deferred in setting of likely prostatitis  Lymph: No cervical or inguinal lymphadenopathy. Skin: No rashes, bruises or suspicious lesions. Neurologic: Grossly intact, no focal deficits, moving all 4 extremities. Psychiatric: Normal mood and affect.  Laboratory Data: Reviewed  Urinalysis today 0 WBCs, 0 RBCs, no bacteria, nitrite negative  Pertinent Imaging: I have personally reviewed the CT stone protocol, prostate measures  58 cc, no hydronephrosis or urolithiasis.  Likely right parapelvic cyst.  Assessment & Plan:   In summary, the patient is a 73 year old diabetic male with severe urinary frequency and dysuria in setting of likely prostatitis.  He previously improved with a short course of antibiotics, however his symptoms have returned.  We discussed the  diagnosis of acute prostatitis in detail.  I recommended transitioning to a 4-week course of Bactrim for improved prostatic penetration.  We did discuss a possible pelvic CT scan with contrast to evaluate for prostatic abscess, however he is not having significant pelvic or perineal pain at this time, and he previously improved with antibiotics alone, in addition his urine is completely negative today.  -RTC 3 months for symptom check -4-week course of treatment dose Bactrim -Myrbetriq 50 mg daily for urinary frequency, continue Flomax -RTC sooner if fevers, chills, worsening symptoms-> would consider pelvic CT with contrast at that time to evaluate for prostatic abscess  Billey Co, MD  [place] [address] [place],  [id number] [phone]

## 2018-07-11 NOTE — Patient Instructions (Signed)
Prostatitis  Prostatitis is swelling or inflammation of the prostate gland. The prostate is a walnut-sized gland that is involved in the production of semen. It is located below a man's bladder, in front of the rectum. There are four types of prostatitis:  Chronic nonbacterial prostatitis. This is the most common type of prostatitis. It may be associated with a viral infection or autoimmune disorder.  Acute bacterial prostatitis. This is the least common type of prostatitis. It starts quickly and is usually associated with a bladder infection, high fever, and shaking chills. It can occur at any age.  Chronic bacterial prostatitis. This type usually results from acute bacterial prostatitis that happens repeatedly (is recurrent) or has not been treated properly. It can occur in men of any age but is most common among middle-aged men whose prostate has begun to get larger. The symptoms are not as severe as symptoms caused by acute bacterial prostatitis.  Prostatodynia or chronic pelvic pain syndrome (CPPS). This type is also called pelvic floor disorder. It is associated with increased muscular tone in the pelvis surrounding the prostate. What are the causes? Bacterial prostatitis is caused by infection from bacteria. Chronic nonbacterial prostatitis may be caused by:  Urinary tract infections (UTIs).  Nerve damage.  A response by the body's disease-fighting system (autoimmune response).  Chemicals in the urine. The causes of the other types of prostatitis are usually not known. What are the signs or symptoms? Symptoms of this condition vary depending upon the type of prostatitis. If you have acute bacterial prostatitis, you may experience:  Urinary symptoms, such as: ? Painful urination. ? Burning during urination. ? Frequent and sudden urges to urinate. ? Inability to start urinating. ? A weak or interrupted stream of urine.  Vomiting.  Nausea.  Fever.  Chills.  Inability to  empty the bladder completely.  Pain in the: ? Muscles or joints. ? Lower back. ? Lower abdomen. If you have any of the other types of prostatitis, you may experience:  Urinary symptoms, such as: ? Sudden urges to urinate. ? Frequent urination. ? Difficulty starting urination. ? Weak urine stream. ? Dribbling after urination.  Discharge from the urethra. The urethra is a tube that opens at the end of the penis.  Pain in the: ? Testicles. ? Penis or tip of the penis. ? Rectum. ? Area in front of the rectum and below the scrotum (perineum).  Problems with sexual function.  Painful ejaculation.  Bloody semen. How is this diagnosed? This condition may be diagnosed based on:  A physical and medical exam.  Your symptoms.  A urine test to check for bacteria.  An exam in which a health care provider uses a finger to feel the prostate (digital rectal exam).  A test of a sample of semen.  Blood tests.  Ultrasound.  Removal of prostate tissue to be examined under a microscope (biopsy).  Tests to check how your body handles urine (urodynamic tests).  A test to look inside your bladder or urethra (cystoscopy). How is this treated? Treatment for this condition depends on the type of prostatitis. Treatment may involve:  Medicines to relieve pain or inflammation.  Medicines to help relax your muscles.  Physical therapy.  Heat therapy.  Techniques to help you control certain body functions (biofeedback).  Relaxation exercises.  Antibiotic medicine, if your condition is caused by bacteria.  Warm water baths (sitz baths). Sitz baths help with relaxing your pelvic floor muscles, which helps to relieve pressure on the prostate.   pain or inflammation.  · Medicines to help relax your muscles.  · Physical therapy.  · Heat therapy.  · Techniques to help you control certain body functions (biofeedback).  · Relaxation exercises.  · Antibiotic medicine, if your condition is caused by bacteria.  · Warm water baths (sitz baths). Sitz baths help with relaxing your pelvic floor muscles, which helps to relieve pressure on the prostate.  Follow these instructions at home:    · Take over-the-counter and prescription medicines only as told by your health care provider.  · If you were prescribed an antibiotic, take it as told by your health care provider. Do not stop taking the  antibiotic even if you start to feel better.  · If physical therapy, biofeedback, or relaxation exercises were prescribed, do exercises as instructed.  · Take sitz baths as directed by your health care provider. For a sitz bath, sit in warm water that is deep enough to cover your hips and buttocks.  · Keep all follow-up visits as told by your health care provider. This is important.  Contact a health care provider if:  · Your symptoms get worse.  · You have a fever.  Get help right away if:  · You have chills.  · You feel nauseous.  · You vomit.  · You feel light-headed or feel like you are going to faint.  · You are unable to urinate.  · You have blood or blood clots in your urine.  This information is not intended to replace advice given to you by your health care provider. Make sure you discuss any questions you have with your health care provider.  Document Released: 05/13/2000 Document Revised: 07/29/2017 Document Reviewed: 02/04/2016  Elsevier Interactive Patient Education © 2019 [employer]

## 2018-07-31 ENCOUNTER — Ambulatory Visit: Payer: Self-pay | Admitting: Urology

## 2018-08-22 ENCOUNTER — Ambulatory Visit: Admit: 2018-08-22 | Payer: Medicare Other | Admitting: Ophthalmology

## 2018-08-22 SURGERY — PHACOEMULSIFICATION, CATARACT, WITH IOL INSERTION
Anesthesia: Topical | Laterality: Left

## 2018-10-08 ENCOUNTER — Other Ambulatory Visit: Payer: Self-pay

## 2018-10-08 ENCOUNTER — Telehealth (INDEPENDENT_AMBULATORY_CARE_PROVIDER_SITE_OTHER): Payer: Medicare Other | Admitting: Urology

## 2018-10-08 DIAGNOSIS — N410 Acute prostatitis: Secondary | ICD-10-CM | POA: Diagnosis not present

## 2018-10-08 NOTE — Progress Notes (Signed)
Virtual Visit via Telephone Note  I connected with Bryan Navarro on 10/08/18 at 10:30 AM EDT by telephone and verified that I am speaking with the correct person using two identifiers.   I discussed the limitations, risks, security and privacy concerns of performing an evaluation and management service by telephone and the availability of in person appointments. We discussed the impact of the COVID-19 on the healthcare system, and the importance of social distancing and reducing patient and provider exposure. I also discussed with the patient that there may be a patient responsible charge related to this service. The patient expressed understanding and agreed to proceed.  Reason for visit: Follow up prostatitis/LUTS  History of Present Illness: I had phone follow-up with Bryan Navarro today for history of acute prostatitis and urinary symptoms.  He is a 73 year old diabetic male who presented to the ED in January 2020 with severe urinary frequency, pelvic pain, and dysuria with a leukocytosis to 25K, and urine culture ultimately grew multiple species.  CT scan without contrast showed no hydronephrosis or urolithiasis.  Prostate measured 58 g.  He was treated with a 10-day course of antibiotics by the ED which initially improved his symptoms, however they returned in early February and I met him in clinic on 07/11/2018 for the first time.  We treated him with a 4-week course of treatment dose Bactrim, and Myrbetriq and Flomax as needed for his urinary symptoms of frequency and dysuria.  He reports a complete resolution of his urinary symptoms after completing the month-long course of Bactrim.  He specifically denies any dysuria, frequency, fevers, flank pain, weak stream, or hematuria.  He has stopped taking the Myrbetriq.  Overall he is very pleased with his urinary symptoms and quality of life.  Assessment and Plan: 73 year old diabetic male with history of acute prostatitis in January 2020, completely  resolved with 4-week course of Bactrim.  Follow Up: -RTC 1 year for symptom check -Consider CT urogram and cystoscopy if recurrent episodes of infection/prostatitis   I discussed the assessment and treatment plan with the patient. The patient was provided an opportunity to ask questions and all were answered. The patient agreed with the plan and demonstrated an understanding of the instructions.   The patient was advised to call back or seek an in-person evaluation if the symptoms worsen or if the condition fails to improve as anticipated.  I provided 11 minutes of non-face-to-face time during this encounter.   Billey Co, MD

## 2018-10-09 ENCOUNTER — Ambulatory Visit: Payer: Medicare Other | Admitting: Urology

## 2018-10-12 ENCOUNTER — Other Ambulatory Visit: Payer: Self-pay

## 2018-10-12 ENCOUNTER — Other Ambulatory Visit
Admission: RE | Admit: 2018-10-12 | Discharge: 2018-10-12 | Disposition: A | Discharge status: Home / Self Care | Payer: Medicare Other | Source: Ambulatory Visit | Attending: Ophthalmology | Admitting: Ophthalmology

## 2018-10-12 DIAGNOSIS — Z1159 Encounter for screening for other viral diseases: Secondary | ICD-10-CM | POA: Insufficient documentation

## 2018-10-13 LAB — NOVEL CORONAVIRUS, NAA (HOSP ORDER, SEND-OUT TO REF LAB; TAT 18-24 HRS): SARS-CoV-2, NAA: NOT DETECTED

## 2018-10-16 NOTE — Anesthesia Preprocedure Evaluation (Addendum)
Anesthesia Evaluation  Patient identified by MRN, date of birth, ID band Patient awake    Reviewed: Allergy & Precautions, NPO status , Patient's Chart, lab work & pertinent test results  History of Anesthesia Complications Negative for: history of anesthetic complications  Airway Mallampati: III   Neck ROM: Full    Dental no notable dental hx.    Pulmonary former smoker (quit 2016),    Pulmonary exam normal breath sounds clear to auscultation       Cardiovascular Exercise Tolerance: Good negative cardio ROS Normal cardiovascular exam Rhythm:Regular Rate:Normal     Neuro/Psych negative neurological ROS     GI/Hepatic PUD,   Endo/Other  diabetes, Type 2  Renal/GU negative Renal ROS     Musculoskeletal  (+) Arthritis ,   Abdominal   Peds  Hematology negative hematology ROS (+)   Anesthesia Other Findings   Reproductive/Obstetrics                           Anesthesia Physical Anesthesia Plan  ASA: II  Anesthesia Plan: MAC   Post-op Pain Management:    Induction: Intravenous  PONV Risk Score and Plan: 1 and TIVA and Midazolam  Airway Management Planned: Natural Airway  Additional Equipment:   Intra-op Plan:   Post-operative Plan:   Informed Consent: I have reviewed the patients History and Physical, chart, labs and discussed the procedure including the risks, benefits and alternatives for the proposed anesthesia with the patient or authorized representative who has indicated his/her understanding and acceptance.       Plan Discussed with: CRNA  Anesthesia Plan Comments:        Anesthesia Quick Evaluation

## 2018-10-17 ENCOUNTER — Ambulatory Visit: Payer: Medicare Other | Admitting: Anesthesiology

## 2018-10-17 ENCOUNTER — Ambulatory Visit
Admission: RE | Admit: 2018-10-17 | Discharge: 2018-10-17 | Disposition: A | Discharge status: Home / Self Care | Payer: Medicare Other | Attending: Ophthalmology | Admitting: Ophthalmology

## 2018-10-17 ENCOUNTER — Encounter
Admission: RE | Disposition: A | Discharge status: Home / Self Care | Payer: Self-pay | Source: Home / Self Care | Attending: Ophthalmology

## 2018-10-17 DIAGNOSIS — H2512 Age-related nuclear cataract, left eye: Secondary | ICD-10-CM | POA: Insufficient documentation

## 2018-10-17 DIAGNOSIS — Z87891 Personal history of nicotine dependence: Secondary | ICD-10-CM | POA: Insufficient documentation

## 2018-10-17 DIAGNOSIS — Z79899 Other long term (current) drug therapy: Secondary | ICD-10-CM | POA: Diagnosis not present

## 2018-10-17 DIAGNOSIS — Z794 Long term (current) use of insulin: Secondary | ICD-10-CM | POA: Diagnosis not present

## 2018-10-17 DIAGNOSIS — J449 Chronic obstructive pulmonary disease, unspecified: Secondary | ICD-10-CM | POA: Insufficient documentation

## 2018-10-17 DIAGNOSIS — E7800 Pure hypercholesterolemia, unspecified: Secondary | ICD-10-CM | POA: Insufficient documentation

## 2018-10-17 DIAGNOSIS — E119 Type 2 diabetes mellitus without complications: Secondary | ICD-10-CM | POA: Diagnosis not present

## 2018-10-17 HISTORY — DX: Other cervical disc degeneration, unspecified cervical region: M50.30

## 2018-10-17 HISTORY — DX: Cardiac murmur, unspecified: R01.1

## 2018-10-17 HISTORY — DX: Other cardiac sounds: R01.2

## 2018-10-17 HISTORY — PX: CATARACT EXTRACTION W/PHACO: SHX586

## 2018-10-17 LAB — GLUCOSE, CAPILLARY
Glucose-Capillary: 98 mg/dL (ref 70–99)
Glucose-Capillary: 108 mg/dL — ABNORMAL HIGH (ref 70–99)

## 2018-10-17 SURGERY — PHACOEMULSIFICATION, CATARACT, WITH IOL INSERTION
Anesthesia: Monitor Anesthesia Care | Site: Eye | Laterality: Left

## 2018-10-17 MED ORDER — CEFUROXIME OPHTHALMIC INJECTION 1 MG/0.1 ML
INJECTION | OPHTHALMIC | Status: DC | PRN
  Administered 2018-10-17: 0.1 mL via INTRACAMERAL

## 2018-10-17 MED ORDER — LACTATED RINGERS IV SOLN: INTRAVENOUS | Status: DC

## 2018-10-17 MED ORDER — LIDOCAINE HCL (PF) 2 % IJ SOLN
INTRAOCULAR | Status: DC | PRN
  Administered 2018-10-17: 1 mL

## 2018-10-17 MED ORDER — NA HYALUR & NA CHOND-NA HYALUR 0.4-0.35 ML IO KIT
PACK | INTRAOCULAR | Status: DC | PRN
  Administered 2018-10-17: 1 mL via INTRAOCULAR

## 2018-10-17 MED ORDER — MOXIFLOXACIN HCL 0.5 % OP SOLN
1.0000 [drp] | OPHTHALMIC | Status: DC | PRN
  Administered 2018-10-17 (×3): 1 [drp] via OPHTHALMIC

## 2018-10-17 MED ORDER — ARMC OPHTHALMIC DILATING DROPS
1.0000 "application " | OPHTHALMIC | Status: DC | PRN
  Administered 2018-10-17 (×3): 1 via OPHTHALMIC

## 2018-10-17 MED ORDER — FENTANYL CITRATE (PF) 100 MCG/2ML IJ SOLN
INTRAMUSCULAR | Status: DC | PRN
  Administered 2018-10-17: 50 ug via INTRAVENOUS

## 2018-10-17 MED ORDER — MIDAZOLAM HCL 2 MG/2ML IJ SOLN
INTRAMUSCULAR | Status: DC | PRN
  Administered 2018-10-17: 2 mg via INTRAVENOUS

## 2018-10-17 MED ORDER — TETRACAINE HCL 0.5 % OP SOLN
1.0000 [drp] | OPHTHALMIC | Status: DC | PRN
  Administered 2018-10-17 (×3): 1 [drp] via OPHTHALMIC

## 2018-10-17 MED ORDER — BRIMONIDINE TARTRATE-TIMOLOL 0.2-0.5 % OP SOLN
OPHTHALMIC | Status: DC | PRN
  Administered 2018-10-17: 1 [drp] via OPHTHALMIC

## 2018-10-17 SURGICAL SUPPLY — 20 items
CANNULA ANT/CHMB 27G (MISCELLANEOUS) ×1 IMPLANT
CANNULA ANT/CHMB 27GA (MISCELLANEOUS) ×3 IMPLANT
GLOVE SURG LX 7.5 STRW (GLOVE) ×2
GLOVE SURG LX STRL 7.5 STRW (GLOVE) ×1 IMPLANT
GLOVE SURG TRIUMPH 8.0 PF LTX (GLOVE) ×3 IMPLANT
GOWN STRL REUS W/ TWL LRG LVL3 (GOWN DISPOSABLE) ×2 IMPLANT
GOWN STRL REUS W/TWL LRG LVL3 (GOWN DISPOSABLE) ×4
LENS IOL TECNIS ITEC 15.0 (Intraocular Lens) ×2 IMPLANT
MARKER SKIN DUAL TIP RULER LAB (MISCELLANEOUS) ×3 IMPLANT
NDL FILTER BLUNT 18X1 1/2 (NEEDLE) ×1 IMPLANT
NEEDLE FILTER BLUNT 18X 1/2SAF (NEEDLE) ×2
NEEDLE FILTER BLUNT 18X1 1/2 (NEEDLE) ×1 IMPLANT
PACK CATARACT BRASINGTON (MISCELLANEOUS) ×3 IMPLANT
PACK EYE AFTER SURG (MISCELLANEOUS) ×3 IMPLANT
PACK OPTHALMIC (MISCELLANEOUS) ×3 IMPLANT
SYR 3ML LL SCALE MARK (SYRINGE) ×3 IMPLANT
SYR 5ML LL (SYRINGE) ×3 IMPLANT
SYR TB 1ML LUER SLIP (SYRINGE) ×3 IMPLANT
WATER STERILE IRR 500ML POUR (IV SOLUTION) ×3 IMPLANT
WIPE NON LINTING 3.25X3.25 (MISCELLANEOUS) ×3 IMPLANT

## 2018-10-17 NOTE — Anesthesia Procedure Notes (Signed)
Procedure Name: MAC Date/Time: 10/17/2018 8:05 AM Performed by: Janna Arch, CRNA Pre-anesthesia Checklist: Patient identified, Emergency Drugs available, Suction available, Timeout performed and Patient being monitored Patient Re-evaluated:Patient Re-evaluated prior to induction Oxygen Delivery Method: Nasal cannula Placement Confirmation: positive ETCO2

## 2018-10-17 NOTE — Transfer of Care (Signed)
Immediate Anesthesia Transfer of Care Note  Patient: Bryan Navarro  Procedure(s) Performed: CATARACT EXTRACTION PHACO AND INTRAOCULAR LENS PLACEMENT ([place]) (Left Eye)  Patient Location: PACU  Anesthesia Type: MAC  Level of Consciousness: awake, alert  and patient cooperative  Airway and Oxygen Therapy: Patient Spontanous Breathing and Patient connected to supplemental oxygen  Post-op Assessment: Post-op Vital signs reviewed, Patient's Cardiovascular Status Stable, Respiratory Function Stable, Patent Airway and No signs of Nausea or vomiting  Post-op Vital Signs: Reviewed and stable  Complications: No apparent anesthesia complications

## 2018-10-17 NOTE — Op Note (Signed)
OPERATIVE NOTE  Bryan Navarro [id number] 10/17/2018   PREOPERATIVE DIAGNOSIS:  Nuclear sclerotic cataract left eye. H25.12   POSTOPERATIVE DIAGNOSIS:    Nuclear sclerotic cataract left eye.     PROCEDURE:  Phacoemusification with posterior chamber intraocular lens placement of the left eye   LENS:   Implant Name Type Inv. Item Serial No. Manufacturer Lot No. LRB No. Used  LENS IOL DIOP 15.0 - [id number] Intraocular Lens LENS IOL DIOP 15.0 [id number]  Left 1        ULTRASOUND TIME: 22  % of 1 minutes 38 seconds, CDE 21.5  SURGEON:  Wyonia Hough, MD   ANESTHESIA:  Topical with tetracaine drops and 2% Xylocaine jelly, augmented with 1% preservative-free intracameral lidocaine.    COMPLICATIONS:  None.   DESCRIPTION OF PROCEDURE:  The patient was identified in the holding room and transported to the operating room and placed in the supine position under the operating microscope.  The left eye was identified as the operative eye and it was prepped and draped in the usual sterile ophthalmic fashion.   A 1 millimeter clear-corneal paracentesis was made at the 1:30 position.  0.5 ml of preservative-free 1% lidocaine was injected into the anterior chamber.  The anterior chamber was filled with Viscoat viscoelastic.  A 2.4 millimeter keratome was used to make a near-clear corneal incision at the 10:30 position.  .  A curvilinear capsulorrhexis was made with a cystotome and capsulorrhexis forceps.  Balanced salt solution was used to hydrodissect and hydrodelineate the nucleus.   Phacoemulsification was then used in stop and chop fashion to remove the lens nucleus and epinucleus.  The remaining cortex was then removed using the irrigation and aspiration handpiece. Provisc was then placed into the capsular bag to distend it for lens placement.  A lens was then injected into the capsular bag.  The remaining viscoelastic was aspirated.   Wounds were hydrated with balanced salt  solution.  The anterior chamber was inflated to a physiologic pressure with balanced salt solution.  No wound leaks were noted. Cefuroxime 0.1 ml of a 10mg /ml solution was injected into the anterior chamber for a dose of 1 mg of intracameral antibiotic at the completion of the case.   Timolol and Brimonidine drops were applied to the eye.  The patient was taken to the recovery room in stable condition without complications of anesthesia or surgery.  Bryan Navarro 10/17/2018, 8:28 AM

## 2018-10-17 NOTE — Anesthesia Postprocedure Evaluation (Signed)
Anesthesia Post Note  Patient: Bryan Navarro  Procedure(s) Performed: CATARACT EXTRACTION PHACO AND INTRAOCULAR LENS PLACEMENT ([place]) (Left Eye)  Patient location during evaluation: PACU Anesthesia Type: MAC Level of consciousness: awake and alert, oriented and patient cooperative Pain management: pain level controlled Vital Signs Assessment: post-procedure vital signs reviewed and stable Respiratory status: spontaneous breathing, nonlabored ventilation and respiratory function stable Cardiovascular status: blood pressure returned to baseline and stable Postop Assessment: adequate PO intake Anesthetic complications: no    Darrin Nipper

## 2018-10-17 NOTE — H&P (Signed)

## 2018-11-08 ENCOUNTER — Encounter: Payer: Self-pay | Admitting: *Deleted

## 2018-11-08 ENCOUNTER — Other Ambulatory Visit: Payer: Self-pay

## 2018-11-08 NOTE — Discharge Instructions (Signed)

## 2018-11-09 ENCOUNTER — Other Ambulatory Visit
Admission: RE | Admit: 2018-11-09 | Discharge: 2018-11-09 | Disposition: A | Discharge status: Home / Self Care | Payer: Medicare Other | Source: Ambulatory Visit | Attending: Ophthalmology | Admitting: Ophthalmology

## 2018-11-09 ENCOUNTER — Other Ambulatory Visit: Payer: Self-pay

## 2018-11-09 DIAGNOSIS — Z1159 Encounter for screening for other viral diseases: Secondary | ICD-10-CM | POA: Insufficient documentation

## 2018-11-09 DIAGNOSIS — Z01812 Encounter for preprocedural laboratory examination: Secondary | ICD-10-CM | POA: Insufficient documentation

## 2018-11-10 LAB — NOVEL CORONAVIRUS, NAA (HOSP ORDER, SEND-OUT TO REF LAB; TAT 18-24 HRS): SARS-CoV-2, NAA: NOT DETECTED

## 2018-11-14 ENCOUNTER — Ambulatory Visit: Payer: Medicare Other | Admitting: Anesthesiology

## 2018-11-14 ENCOUNTER — Ambulatory Visit
Admission: RE | Admit: 2018-11-14 | Discharge: 2018-11-14 | Disposition: A | Discharge status: Home / Self Care | Payer: Medicare Other | Attending: Ophthalmology | Admitting: Ophthalmology

## 2018-11-14 ENCOUNTER — Encounter: Payer: Self-pay | Admitting: Anesthesiology

## 2018-11-14 ENCOUNTER — Encounter
Admission: RE | Disposition: A | Discharge status: Home / Self Care | Payer: Self-pay | Source: Home / Self Care | Attending: Ophthalmology

## 2018-11-14 DIAGNOSIS — E1140 Type 2 diabetes mellitus with diabetic neuropathy, unspecified: Secondary | ICD-10-CM | POA: Diagnosis not present

## 2018-11-14 DIAGNOSIS — I10 Essential (primary) hypertension: Secondary | ICD-10-CM | POA: Insufficient documentation

## 2018-11-14 DIAGNOSIS — J449 Chronic obstructive pulmonary disease, unspecified: Secondary | ICD-10-CM | POA: Insufficient documentation

## 2018-11-14 DIAGNOSIS — Z79899 Other long term (current) drug therapy: Secondary | ICD-10-CM | POA: Diagnosis not present

## 2018-11-14 DIAGNOSIS — Z794 Long term (current) use of insulin: Secondary | ICD-10-CM | POA: Diagnosis not present

## 2018-11-14 DIAGNOSIS — Z87891 Personal history of nicotine dependence: Secondary | ICD-10-CM | POA: Diagnosis not present

## 2018-11-14 DIAGNOSIS — H2511 Age-related nuclear cataract, right eye: Secondary | ICD-10-CM | POA: Diagnosis not present

## 2018-11-14 DIAGNOSIS — E7800 Pure hypercholesterolemia, unspecified: Secondary | ICD-10-CM | POA: Insufficient documentation

## 2018-11-14 DIAGNOSIS — E1136 Type 2 diabetes mellitus with diabetic cataract: Secondary | ICD-10-CM | POA: Insufficient documentation

## 2018-11-14 HISTORY — PX: CATARACT EXTRACTION W/PHACO: SHX586

## 2018-11-14 LAB — GLUCOSE, CAPILLARY
Glucose-Capillary: 108 mg/dL — ABNORMAL HIGH (ref 70–99)
Glucose-Capillary: 115 mg/dL — ABNORMAL HIGH (ref 70–99)
Glucose-Capillary: 124 mg/dL — ABNORMAL HIGH (ref 70–99)

## 2018-11-14 SURGERY — PHACOEMULSIFICATION, CATARACT, WITH IOL INSERTION
Anesthesia: Monitor Anesthesia Care | Site: Eye | Laterality: Right

## 2018-11-14 MED ORDER — ARMC OPHTHALMIC DILATING DROPS
1.0000 "application " | OPHTHALMIC | Status: DC | PRN
  Administered 2018-11-14 (×3): 1 via OPHTHALMIC

## 2018-11-14 MED ORDER — MOXIFLOXACIN HCL 0.5 % OP SOLN
1.0000 [drp] | OPHTHALMIC | Status: DC | PRN
  Administered 2018-11-14 (×3): 1 [drp] via OPHTHALMIC

## 2018-11-14 MED ORDER — FENTANYL CITRATE (PF) 100 MCG/2ML IJ SOLN
INTRAMUSCULAR | Status: DC | PRN
  Administered 2018-11-14: 50 ug via INTRAVENOUS

## 2018-11-14 MED ORDER — EPINEPHRINE PF 1 MG/ML IJ SOLN
INTRAOCULAR | Status: DC | PRN
  Administered 2018-11-14: 69 mL via OPHTHALMIC

## 2018-11-14 MED ORDER — MIDAZOLAM HCL 2 MG/2ML IJ SOLN
INTRAMUSCULAR | Status: DC | PRN
  Administered 2018-11-14 (×2): 1 mg via INTRAVENOUS

## 2018-11-14 MED ORDER — LIDOCAINE HCL (PF) 2 % IJ SOLN
INTRAOCULAR | Status: DC | PRN
  Administered 2018-11-14: 1 mL

## 2018-11-14 MED ORDER — OXYCODONE HCL 5 MG PO TABS: 5.0000 mg | ORAL_TABLET | Freq: Once | ORAL | Status: DC | PRN

## 2018-11-14 MED ORDER — BRIMONIDINE TARTRATE-TIMOLOL 0.2-0.5 % OP SOLN
OPHTHALMIC | Status: DC | PRN
  Administered 2018-11-14: 1 [drp] via OPHTHALMIC

## 2018-11-14 MED ORDER — NA HYALUR & NA CHOND-NA HYALUR 0.4-0.35 ML IO KIT
PACK | INTRAOCULAR | Status: DC | PRN
  Administered 2018-11-14: 1 mL via INTRAOCULAR

## 2018-11-14 MED ORDER — CEFUROXIME OPHTHALMIC INJECTION 1 MG/0.1 ML
INJECTION | OPHTHALMIC | Status: DC | PRN
  Administered 2018-11-14: 0.1 mL via INTRACAMERAL

## 2018-11-14 MED ORDER — TETRACAINE HCL 0.5 % OP SOLN
1.0000 [drp] | OPHTHALMIC | Status: DC | PRN
  Administered 2018-11-14 (×3): 1 [drp] via OPHTHALMIC

## 2018-11-14 MED ORDER — OXYCODONE HCL 5 MG/5ML PO SOLN: 5.0000 mg | Freq: Once | ORAL | Status: DC | PRN

## 2018-11-14 SURGICAL SUPPLY — 20 items
CANNULA ANT/CHMB 27G (MISCELLANEOUS) ×1 IMPLANT
CANNULA ANT/CHMB 27GA (MISCELLANEOUS) ×3 IMPLANT
GLOVE SURG LX 7.5 STRW (GLOVE) ×2
GLOVE SURG LX STRL 7.5 STRW (GLOVE) ×1 IMPLANT
GLOVE SURG TRIUMPH 8.0 PF LTX (GLOVE) ×3 IMPLANT
GOWN STRL REUS W/ TWL LRG LVL3 (GOWN DISPOSABLE) ×2 IMPLANT
GOWN STRL REUS W/TWL LRG LVL3 (GOWN DISPOSABLE) ×4
LENS IOL TECNIS ITEC 17.5 (Intraocular Lens) ×2 IMPLANT
MARKER SKIN DUAL TIP RULER LAB (MISCELLANEOUS) ×3 IMPLANT
NDL FILTER BLUNT 18X1 1/2 (NEEDLE) ×1 IMPLANT
NEEDLE FILTER BLUNT 18X 1/2SAF (NEEDLE) ×2
NEEDLE FILTER BLUNT 18X1 1/2 (NEEDLE) ×1 IMPLANT
PACK CATARACT BRASINGTON (MISCELLANEOUS) ×3 IMPLANT
PACK EYE AFTER SURG (MISCELLANEOUS) ×3 IMPLANT
PACK OPTHALMIC (MISCELLANEOUS) ×3 IMPLANT
SYR 3ML LL SCALE MARK (SYRINGE) ×3 IMPLANT
SYR 5ML LL (SYRINGE) ×3 IMPLANT
SYR TB 1ML LUER SLIP (SYRINGE) ×3 IMPLANT
WATER STERILE IRR 500ML POUR (IV SOLUTION) ×3 IMPLANT
WIPE NON LINTING 3.25X3.25 (MISCELLANEOUS) ×3 IMPLANT

## 2018-11-14 NOTE — Anesthesia Postprocedure Evaluation (Signed)
Anesthesia Post Note  Patient: Bryan Navarro  Procedure(s) Performed: CATARACT EXTRACTION PHACO AND INTRAOCULAR LENS PLACEMENT ([facility]) RIGHT DIABETES (Right Eye)  Patient location during evaluation: PACU Anesthesia Type: MAC Level of consciousness: awake and alert Pain management: pain level controlled Vital Signs Assessment: post-procedure vital signs reviewed and stable Respiratory status: spontaneous breathing, nonlabored ventilation, respiratory function stable and patient connected to nasal cannula oxygen Cardiovascular status: stable and blood pressure returned to baseline Postop Assessment: no apparent nausea or vomiting Anesthetic complications: no    Massiel Stipp

## 2018-11-14 NOTE — Anesthesia Procedure Notes (Signed)
Procedure Name: MAC Performed by: Elliannah Wayment, CRNA Pre-anesthesia Checklist: Patient identified, Emergency Drugs available, Suction available, Timeout performed and Patient being monitored Patient Re-evaluated:Patient Re-evaluated prior to induction Oxygen Delivery Method: Nasal cannula Placement Confirmation: positive ETCO2       

## 2018-11-14 NOTE — Transfer of Care (Signed)
Immediate Anesthesia Transfer of Care Note  Patient: Bryan Navarro  Procedure(s) Performed: CATARACT EXTRACTION PHACO AND INTRAOCULAR LENS PLACEMENT ([facility]) RIGHT DIABETES (Right Eye)  Patient Location: PACU  Anesthesia Type: MAC  Level of Consciousness: awake, alert  and patient cooperative  Airway and Oxygen Therapy: Patient Spontanous Breathing and Patient connected to supplemental oxygen  Post-op Assessment: Post-op Vital signs reviewed, Patient's Cardiovascular Status Stable, Respiratory Function Stable, Patent Airway and No signs of Nausea or vomiting  Post-op Vital Signs: Reviewed and stable  Complications: No apparent anesthesia complications

## 2018-11-14 NOTE — Op Note (Signed)
LOCATION:  [place]   PREOPERATIVE DIAGNOSIS:    Nuclear sclerotic cataract right eye. H25.11   POSTOPERATIVE DIAGNOSIS:  Nuclear sclerotic cataract right eye.     PROCEDURE:  Phacoemusification with posterior chamber intraocular lens placement of the right eye   LENS:   Implant Name Type Inv. Item Serial No. Manufacturer Lot No. LRB No. Used Action  LENS IOL DIOP 17.5 - [id number] Intraocular Lens LENS IOL DIOP 17.5 [id number]  Right 1 Implanted        ULTRASOUND TIME: 16 % of 1 minutes, 19 seconds.  CDE 13.1   SURGEON:  Wyonia Hough, MD   ANESTHESIA:  Topical with tetracaine drops, augmented with 1% preservative-free intracameral lidocaine.    COMPLICATIONS:  None.   DESCRIPTION OF PROCEDURE:  The patient was identified in the holding room and transported to the operating room and placed in the supine position under the operating microscope.  The right eye was identified as the operative eye and it was prepped and draped in the usual sterile ophthalmic fashion.   A 1 millimeter clear-corneal paracentesis was made at the 12:00 position.  0.5 ml of preservative-free 1% lidocaine was injected into the anterior chamber. The anterior chamber was filled with Viscoat viscoelastic.  A 2.4 millimeter keratome was used to make a near-clear corneal incision at the 9:00 position.  A curvilinear capsulorrhexis was made with a cystotome and capsulorrhexis forceps.  Balanced salt solution was used to hydrodissect and hydrodelineate the nucleus.   Phacoemulsification was then used in stop and chop fashion to remove the lens nucleus and epinucleus.  The remaining cortex was then removed using the irrigation and aspiration handpiece. Provisc was then placed into the capsular bag to distend it for lens placement.  A lens was then injected into the capsular bag.  The remaining viscoelastic was aspirated.   Wounds were hydrated with balanced salt solution.  The anterior chamber  was inflated to a physiologic pressure with balanced salt solution.  No wound leaks were noted. Cefuroxime 0.1 ml of a 10mg /ml solution was injected into the anterior chamber for a dose of 1 mg of intracameral antibiotic at the completion of the case.   Timolol and Brimonidine drops were applied to the eye.  The patient was taken to the recovery room in stable condition without complications of anesthesia or surgery.   Allysen Lazo 11/14/2018, 8:27 AM

## 2018-11-14 NOTE — H&P (Signed)

## 2018-11-14 NOTE — Anesthesia Preprocedure Evaluation (Signed)
Anesthesia Evaluation  Patient identified by MRN, date of birth, ID band Patient awake    Reviewed: NPO status   History of Anesthesia Complications Negative for: history of anesthetic complications  Airway Mallampati: III  TM Distance: >3 FB Neck ROM: Full    Dental no notable dental hx.    Pulmonary neg pulmonary ROS, former smoker,    Pulmonary exam normal breath sounds clear to auscultation       Cardiovascular Exercise Tolerance: Good hypertension, Normal cardiovascular exam Rhythm:Regular Rate:Normal     Neuro/Psych negative neurological ROS  negative psych ROS   GI/Hepatic negative GI ROS, Neg liver ROS,   Endo/Other  diabetes, Type 2  Renal/GU negative Renal ROS   bph    Musculoskeletal  (+) Arthritis ,   Abdominal   Peds  Hematology negative hematology ROS (+)   Anesthesia Other Findings Covid: NEG;  Reproductive/Obstetrics                             Anesthesia Physical  Anesthesia Plan  ASA: II  Anesthesia Plan: MAC   Post-op Pain Management:    Induction: Intravenous  PONV Risk Score and Plan: 1 and TIVA and Midazolam  Airway Management Planned: Natural Airway  Additional Equipment:   Intra-op Plan:   Post-operative Plan:   Informed Consent: I have reviewed the patients History and Physical, chart, labs and discussed the procedure including the risks, benefits and alternatives for the proposed anesthesia with the patient or authorized representative who has indicated his/her understanding and acceptance.       Plan Discussed with: CRNA  Anesthesia Plan Comments:         Anesthesia Quick Evaluation

## 2018-11-15 ENCOUNTER — Encounter: Payer: Self-pay | Admitting: Ophthalmology

## 2019-04-05 ENCOUNTER — Telehealth: Payer: Self-pay | Admitting: *Deleted

## 2019-04-05 ENCOUNTER — Encounter: Payer: Self-pay | Admitting: *Deleted

## 2019-04-05 DIAGNOSIS — Z87891 Personal history of nicotine dependence: Secondary | ICD-10-CM

## 2019-04-05 DIAGNOSIS — Z122 Encounter for screening for malignant neoplasm of respiratory organs: Secondary | ICD-10-CM

## 2019-04-05 NOTE — Telephone Encounter (Signed)
Attempted to contact about scheduling lung screening scan. However there was no answer or voicemail option. Sent mychart message

## 2019-04-08 NOTE — Telephone Encounter (Signed)
Patient has been notified that annual lung cancer screening low dose CT scan is due currently or will be in near future. Confirmed that patient is within the age range of 55-77, and asymptomatic, (no signs or symptoms of lung cancer). Patient denies illness that would prevent curative treatment for lung cancer if found. Verified smoking history, (former, quit 2007, 62 pack year). The shared decision making visit was done 01/17/14. Patient is agreeable for CT scan being scheduled.

## 2019-04-16 ENCOUNTER — Other Ambulatory Visit: Payer: Self-pay

## 2019-04-16 ENCOUNTER — Ambulatory Visit
Admission: RE | Admit: 2019-04-16 | Discharge: 2019-04-16 | Disposition: A | Discharge status: Home / Self Care | Payer: Medicare Other | Source: Ambulatory Visit | Attending: Nurse Practitioner | Admitting: Nurse Practitioner

## 2019-04-16 DIAGNOSIS — Z87891 Personal history of nicotine dependence: Secondary | ICD-10-CM

## 2019-04-16 DIAGNOSIS — Z122 Encounter for screening for malignant neoplasm of respiratory organs: Secondary | ICD-10-CM

## 2019-04-18 ENCOUNTER — Encounter: Payer: Self-pay | Admitting: *Deleted

## 2019-10-07 ENCOUNTER — Other Ambulatory Visit: Payer: Self-pay

## 2019-10-07 ENCOUNTER — Ambulatory Visit: Payer: Medicare Other | Admitting: Dermatology

## 2019-10-07 DIAGNOSIS — D692 Other nonthrombocytopenic purpura: Secondary | ICD-10-CM

## 2019-10-07 DIAGNOSIS — L578 Other skin changes due to chronic exposure to nonionizing radiation: Secondary | ICD-10-CM | POA: Diagnosis not present

## 2019-10-07 DIAGNOSIS — L853 Xerosis cutis: Secondary | ICD-10-CM

## 2019-10-07 DIAGNOSIS — L570 Actinic keratosis: Secondary | ICD-10-CM

## 2019-10-07 NOTE — Patient Instructions (Signed)

## 2019-10-07 NOTE — Progress Notes (Addendum)
   Follow-Up Visit   Subjective  Bryan Navarro is a 74 y.o. male who presents for the following: Follow-up (AK follow up of scalp, temples and forehead treated with LN2 x 15 06/19/19, PDT 07/25/19).    The following portions of the chart were reviewed this encounter and updated as appropriate:  Tobacco  Allergies  Meds  Problems  Med Hx  Surg Hx  Fam Hx      Review of Systems:  No other skin or systemic complaints except as noted in HPI or Assessment and Plan.  Objective  Well appearing patient in no apparent distress; mood and affect are within normal limits.  A focused examination was performed including face, scalp, arms. Relevant physical exam findings are noted in the Assessment and Plan.  Objective  face, scalp, ears (8): Erythematous thin papules/macules with gritty scale.    Assessment & Plan    Actinic Damage - diffuse scaly erythematous macules with underlying dyspigmentation - Recommend daily broad spectrum sunscreen SPF 30+ to sun-exposed areas, reapply every 2 hours as needed.  - Call for new or changing lesions.  Xerosis - diffuse xerotic patches - recommend gentle, hydrating skin care - gentle skin care handout given  Purpura - Violaceous macules and patches - Benign - Related to age, sun damage and/or use of blood thinners - Observe - Can use OTC arnica containing moisturizer such as Dermend Bruise Formula if desired - Call for worsening or other concerns    AK (actinic keratosis) (8) face, scalp, ears  Destruction of lesion - face, scalp, ears Complexity: simple   Destruction method: cryotherapy   Informed consent: discussed and consent obtained   Timeout:  patient name, date of birth, surgical site, and procedure verified Lesion destroyed using liquid nitrogen: Yes   Region frozen until ice ball extended beyond lesion: Yes   Outcome: patient tolerated procedure well with no complications   Post-procedure details: wound care instructions  given    Return in about 6 months (around 04/08/2020).   I, Ashok Cordia, CMA, am acting as scribe for Sarina Ser, MD .Documentation: I have reviewed the above documentation for accuracy and completeness, and I agree with the above.  Sarina Ser, MD

## 2019-10-08 ENCOUNTER — Encounter: Payer: Self-pay | Admitting: Dermatology

## 2020-02-25 ENCOUNTER — Other Ambulatory Visit: Payer: Self-pay | Admitting: Internal Medicine

## 2020-02-25 DIAGNOSIS — R911 Solitary pulmonary nodule: Secondary | ICD-10-CM

## 2020-03-09 ENCOUNTER — Telehealth: Payer: Self-pay | Admitting: *Deleted

## 2020-03-09 NOTE — Telephone Encounter (Signed)
Obtained order for lung screening scan. Left voicemail with patient to return call for scheduling lung screening low dose CT scan one year from last scan or after.   Will also notify PCP that we will continue to contact and schedule patient for annual screening as long as he is eligible for lung screening.

## 2020-03-10 ENCOUNTER — Other Ambulatory Visit: Payer: Self-pay | Admitting: *Deleted

## 2020-03-10 DIAGNOSIS — Z87891 Personal history of nicotine dependence: Secondary | ICD-10-CM

## 2020-03-10 DIAGNOSIS — Z122 Encounter for screening for malignant neoplasm of respiratory organs: Secondary | ICD-10-CM

## 2020-03-30 ENCOUNTER — Ambulatory Visit: Payer: Medicare Other

## 2020-04-08 ENCOUNTER — Other Ambulatory Visit: Payer: Self-pay

## 2020-04-08 ENCOUNTER — Encounter: Payer: Self-pay | Admitting: Dermatology

## 2020-04-08 ENCOUNTER — Ambulatory Visit: Payer: Medicare Other | Admitting: Dermatology

## 2020-04-08 DIAGNOSIS — Z1283 Encounter for screening for malignant neoplasm of skin: Secondary | ICD-10-CM | POA: Diagnosis not present

## 2020-04-08 DIAGNOSIS — L820 Inflamed seborrheic keratosis: Secondary | ICD-10-CM

## 2020-04-08 DIAGNOSIS — L570 Actinic keratosis: Secondary | ICD-10-CM

## 2020-04-08 DIAGNOSIS — D1800 Hemangioma unspecified site: Secondary | ICD-10-CM

## 2020-04-08 DIAGNOSIS — L720 Epidermal cyst: Secondary | ICD-10-CM

## 2020-04-08 DIAGNOSIS — L814 Other melanin hyperpigmentation: Secondary | ICD-10-CM | POA: Diagnosis not present

## 2020-04-08 DIAGNOSIS — L738 Other specified follicular disorders: Secondary | ICD-10-CM | POA: Diagnosis not present

## 2020-04-08 DIAGNOSIS — D229 Melanocytic nevi, unspecified: Secondary | ICD-10-CM

## 2020-04-08 DIAGNOSIS — L821 Other seborrheic keratosis: Secondary | ICD-10-CM

## 2020-04-08 DIAGNOSIS — L578 Other skin changes due to chronic exposure to nonionizing radiation: Secondary | ICD-10-CM

## 2020-04-08 NOTE — Progress Notes (Signed)
Follow-Up Visit   Subjective  Bryan Navarro is a 74 y.o. male who presents for the following: Annual Exam (Hx AK's - no new or changing moles, lesions, or spots that the patient has noticed ) he does have a rough irritating spot of his nose. The patient presents for Total-Body Skin Exam (TBSE) for skin cancer screening and mole check.  The following portions of the chart were reviewed this encounter and updated as appropriate:  Tobacco  Allergies  Meds  Problems  Med Hx  Surg Hx  Fam Hx     Review of Systems:  No other skin or systemic complaints except as noted in HPI or Assessment and Plan.  Objective  Well appearing patient in no apparent distress; mood and affect are within normal limits.  A full examination was performed including scalp, head, eyes, ears, nose, lips, neck, chest, axillae, abdomen, back, buttocks, bilateral upper extremities, bilateral lower extremities, hands, feet, fingers, toes, fingernails, and toenails. All findings within normal limits unless otherwise noted below.  Objective  L nose x 1: Erythematous keratotic or waxy stuck-on papule or plaque.   Objective  Face: Yellow papule   Objective  R ear x 1, L ear x 1, R cheek x 1 (3): Erythematous thin papules/macules with gritty scale.   Objective  R low back: Subcutaneous nodule. R low back with 2 large dilated pores.  Assessment & Plan  Inflamed seborrheic keratosis L nose x 1  Destruction of lesion - L nose x 1 Complexity: simple   Destruction method: cryotherapy   Informed consent: discussed and consent obtained   Timeout:  patient name, date of birth, surgical site, and procedure verified Lesion destroyed using liquid nitrogen: Yes   Region frozen until ice ball extended beyond lesion: Yes   Outcome: patient tolerated procedure well with no complications   Post-procedure details: wound care instructions given    Sebaceous hyperplasia Face  Benign appearing, observe  AK (actinic  keratosis) (3) R ear x 1, L ear x 1, R cheek x 1  Destruction of lesion - R ear x 1, L ear x 1, R cheek x 1 Complexity: simple   Destruction method: cryotherapy   Informed consent: discussed and consent obtained   Timeout:  patient name, date of birth, surgical site, and procedure verified Lesion destroyed using liquid nitrogen: Yes   Region frozen until ice ball extended beyond lesion: Yes   Outcome: patient tolerated procedure well with no complications   Post-procedure details: wound care instructions given    Epidermal inclusion cyst R low back  Benign appearing, observe.  Skin cancer screening   Lentigines - Scattered tan macules - Discussed due to sun exposure - Benign, observe - Call for any changes  Seborrheic Keratoses - Stuck-on, waxy, tan-brown papules and plaques  - Discussed benign etiology and prognosis. - Observe - Call for any changes  Melanocytic Nevi - Tan-brown and/or pink-flesh-colored symmetric macules and papules - Benign appearing on exam today - Observation - Call clinic for new or changing moles - Recommend daily use of broad spectrum spf 30+ sunscreen to sun-exposed areas.   Hemangiomas - Red papules - Discussed benign nature - Observe - Call for any changes  Actinic Damage - Chronic, secondary to cumulative UV/sun exposure - diffuse scaly erythematous macules with underlying dyspigmentation - Recommend daily broad spectrum sunscreen SPF 30+ to sun-exposed areas, reapply every 2 hours as needed.  - Call for new or changing lesions.  Skin cancer screening performed  today.  Return in about 1 year (around 04/08/2021) for TBSE.  Luther Redo, CMA, am acting as scribe for Sarina Ser, MD .  Documentation: I have reviewed the above documentation for accuracy and completeness, and I agree with the above.  Sarina Ser, MD

## 2020-04-16 ENCOUNTER — Other Ambulatory Visit: Payer: Self-pay

## 2020-04-16 ENCOUNTER — Ambulatory Visit
Admission: RE | Admit: 2020-04-16 | Discharge: 2020-04-16 | Disposition: A | Discharge status: Home / Self Care | Payer: Medicare Other | Source: Ambulatory Visit | Attending: Nurse Practitioner | Admitting: Nurse Practitioner

## 2020-04-16 DIAGNOSIS — Z122 Encounter for screening for malignant neoplasm of respiratory organs: Secondary | ICD-10-CM | POA: Diagnosis present

## 2020-04-16 DIAGNOSIS — Z87891 Personal history of nicotine dependence: Secondary | ICD-10-CM | POA: Insufficient documentation

## 2020-04-21 ENCOUNTER — Encounter: Payer: Self-pay | Admitting: *Deleted

## 2020-09-09 ENCOUNTER — Other Ambulatory Visit: Payer: Self-pay

## 2020-09-09 ENCOUNTER — Ambulatory Visit: Payer: Medicare Other | Admitting: Dermatology

## 2020-09-09 DIAGNOSIS — B379 Candidiasis, unspecified: Secondary | ICD-10-CM

## 2020-09-09 DIAGNOSIS — R21 Rash and other nonspecific skin eruption: Secondary | ICD-10-CM | POA: Diagnosis not present

## 2020-09-09 DIAGNOSIS — B372 Candidiasis of skin and nail: Secondary | ICD-10-CM

## 2020-09-09 MED ORDER — CLINDAMYCIN PHOSPHATE 1 % EX LOTN: TOPICAL_LOTION | CUTANEOUS | 2 refills | Status: DC

## 2020-09-09 MED ORDER — FLUCONAZOLE 200 MG PO TABS: ORAL_TABLET | ORAL | 0 refills | Status: DC

## 2020-09-09 MED ORDER — KETOCONAZOLE 2 % EX CREA: TOPICAL_CREAM | CUTANEOUS | 2 refills | Status: DC

## 2020-09-09 NOTE — Progress Notes (Signed)
   Follow-Up Visit   Subjective  Bryan Navarro is a 75 y.o. male who presents for the following: Rash (Pt c/o rash on his penis for over 1 year, treating with Clotrimazole with Betamethasone cream little help, this rash will come and go ).  The following portions of the chart were reviewed this encounter and updated as appropriate:   Tobacco  Allergies  Meds  Problems  Med Hx  Surg Hx  Fam Hx     Review of Systems:  No other skin or systemic complaints except as noted in HPI or Assessment and Plan.  Objective  Well appearing patient in no apparent distress; mood and affect are within normal limits.  A focused examination was performed including penis . Relevant physical exam findings are noted in the Assessment and Plan.  Objective  penis: Weeping and edema   Objective  penis: Thinning of the skin and telangectasia   Assessment & Plan  Candidiasis with intertrigo - weeping and drainage today penis Candiasis with Intertrigo - weeping and drainage today Intertrigo is a chronic recurrent rash that occurs in skin fold areas that may be associated with friction; heat; moisture; yeast; fungus; and bacteria.  It is exacerbated by increased movement / activity; sweating; and higher atmospheric temperature.   Start Diflucan 200 mg take 1 tablet Mon/Wed/Fri x 1 month Start Ketoconazole cream apply to skin M/W/F Start Clindamycin lotion apply to skin Tues/thurs/sat   Ordered Medications: fluconazole (DIFLUCAN) 200 MG tablet  Rash - atrophy and telangiectasias of penis penis Possible atrophy from long term steroid usage D/C Clotrimazole/Betamethasone cream   Ordered Medications: ketoconazole (NIZORAL) 2 % cream clindamycin (CLEOCIN-T) 1 % lotion  Return in about 6 weeks (around 10/21/2020) for Intertrigo .  IMarye Round, CMA, am acting as scribe for Sarina Ser, MD .  Documentation: I have reviewed the above documentation for accuracy and completeness, and I agree  with the above.  Sarina Ser, MD

## 2020-09-10 ENCOUNTER — Encounter: Payer: Self-pay | Admitting: Dermatology

## 2020-10-22 ENCOUNTER — Other Ambulatory Visit: Payer: Self-pay

## 2020-10-22 ENCOUNTER — Ambulatory Visit: Payer: Medicare Other | Admitting: Dermatology

## 2020-10-22 DIAGNOSIS — B372 Candidiasis of skin and nail: Secondary | ICD-10-CM | POA: Diagnosis not present

## 2020-10-22 DIAGNOSIS — B379 Candidiasis, unspecified: Secondary | ICD-10-CM

## 2020-10-22 MED ORDER — FLUCONAZOLE 200 MG PO TABS: ORAL_TABLET | ORAL | 0 refills | Status: DC

## 2020-10-22 NOTE — Progress Notes (Signed)
   Follow-Up Visit   Subjective  Bryan Navarro is a 75 y.o. male who presents for the following: Rash (6 weeks f/u on Candidiasis on the penis treating with Ketoconazole cream, Clindamycin lotion with a good response, pt finished Diflucan tablets a few weeks ago ).  The following portions of the chart were reviewed this encounter and updated as appropriate:   Tobacco  Allergies  Meds  Problems  Med Hx  Surg Hx  Fam Hx     Review of Systems:  No other skin or systemic complaints except as noted in HPI or Assessment and Plan.  Objective  Well appearing patient in no apparent distress; mood and affect are within normal limits.  A focused examination was performed including penis, groin area . Relevant physical exam findings are noted in the Assessment and Plan.  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  It is exacerbated by increased movement / activity; sweating; and higher atmospheric temperature.  Reordered Medications fluconazole (DIFLUCAN) 200 MG tablet  Return for as scheduled Apr 14, 2021 for TBSE and recheck Intertrigo .  IMarye Round, CMA, am acting as scribe for Sarina Ser, MD .  Documentation: I have reviewed the above documentation for accuracy and completeness, and I agree with the above.  Sarina Ser, MD

## 2020-10-22 NOTE — Patient Instructions (Addendum)

## 2020-10-26 ENCOUNTER — Encounter: Payer: Self-pay | Admitting: Dermatology

## 2021-04-01 ENCOUNTER — Other Ambulatory Visit: Payer: Self-pay | Admitting: Internal Medicine

## 2021-04-01 ENCOUNTER — Other Ambulatory Visit (HOSPITAL_COMMUNITY): Payer: Self-pay | Admitting: Internal Medicine

## 2021-04-01 DIAGNOSIS — R911 Solitary pulmonary nodule: Secondary | ICD-10-CM

## 2021-04-14 ENCOUNTER — Other Ambulatory Visit: Payer: Self-pay

## 2021-04-14 ENCOUNTER — Ambulatory Visit: Payer: Medicare Other | Admitting: Dermatology

## 2021-04-14 DIAGNOSIS — L578 Other skin changes due to chronic exposure to nonionizing radiation: Secondary | ICD-10-CM

## 2021-04-14 DIAGNOSIS — Z1283 Encounter for screening for malignant neoplasm of skin: Secondary | ICD-10-CM | POA: Diagnosis not present

## 2021-04-14 DIAGNOSIS — D1800 Hemangioma unspecified site: Secondary | ICD-10-CM

## 2021-04-14 DIAGNOSIS — Z872 Personal history of diseases of the skin and subcutaneous tissue: Secondary | ICD-10-CM

## 2021-04-14 DIAGNOSIS — L439 Lichen planus, unspecified: Secondary | ICD-10-CM

## 2021-04-14 DIAGNOSIS — L304 Erythema intertrigo: Secondary | ICD-10-CM | POA: Diagnosis not present

## 2021-04-14 DIAGNOSIS — L570 Actinic keratosis: Secondary | ICD-10-CM

## 2021-04-14 DIAGNOSIS — D229 Melanocytic nevi, unspecified: Secondary | ICD-10-CM

## 2021-04-14 DIAGNOSIS — L821 Other seborrheic keratosis: Secondary | ICD-10-CM

## 2021-04-14 DIAGNOSIS — L814 Other melanin hyperpigmentation: Secondary | ICD-10-CM

## 2021-04-14 MED ORDER — FLUOCINONIDE 0.05 % EX CREA: 1.0000 "application " | TOPICAL_CREAM | Freq: Two times a day (BID) | CUTANEOUS | 1 refills | Status: DC

## 2021-04-14 MED ORDER — FLUCONAZOLE 200 MG PO TABS
200.0000 mg | ORAL_TABLET | ORAL | 2 refills | Status: DC
Start: 2021-04-14 — End: 2024-02-13

## 2021-04-14 NOTE — Progress Notes (Signed)
Follow-Up Visit   Subjective  Bryan Navarro is a 75 y.o. male who presents for the following: Total body skin exam (Hx of AKs), Rash (Bil arms, 81m, no symptoms), and Intertigo (Groin, recent flare, Started Fluconazole 200mg  1 po 3 x wk for flares, Clindamycin lotion q 3-4 days, Ketoconazole cr q 3-4 days). The patient presents for Total-Body Skin Exam (TBSE) for skin cancer screening and mole check.  The following portions of the chart were reviewed this encounter and updated as appropriate:   Tobacco  Allergies  Meds  Problems  Med Hx  Surg Hx  Fam Hx     Review of Systems:  No other skin or systemic complaints except as noted in HPI or Assessment and Plan.  Objective  Well appearing patient in no apparent distress; mood and affect are within normal limits.  A full examination was performed including scalp, head, eyes, ears, nose, lips, neck, chest, axillae, abdomen, back, buttocks, bilateral upper extremities, bilateral lower extremities, hands, feet, fingers, toes, fingernails, and toenails. All findings within normal limits unless otherwise noted below.  Scalp x 4, face x 5 (9) Pink scaly macules   bil arms Purple polygonal paps            Assessment & Plan  Intertrigo groin Currently flared Patient started oral Diflucan and it is starting to improve.  Intertrigo is a chronic recurrent rash that occurs in skin fold areas that may be associated with friction; heat; moisture; yeast; fungus; and bacteria.  It is exacerbated by increased movement / activity; sweating; and higher atmospheric temperature.  Cont Clindamycin lotion qod to qd Cont Ketoconazole cr qod to qd Cont Fluconazole 200mg  1 po 3 times weekly x 55m, #12 2rf  Side effects of fluconazole (diflucan) include nausea, diarrhea, headache, dizziness, taste changes, rare risk of irritation of the liver, allergy, or decreased blood counts (which could show up as infection or tiredness).   fluconazole  (DIFLUCAN) 200 MG tablet - groin Take 1 tablet (200 mg total) by mouth 3 (three) times a week. Prn flares take 1 po 3 times weekly for 1 month  AK (actinic keratosis) (9) Scalp x 4, face x 5 Destruction of lesion - Scalp x 4, face x 5 Complexity: simple   Destruction method: cryotherapy   Informed consent: discussed and consent obtained   Timeout:  patient name, date of birth, surgical site, and procedure verified Lesion destroyed using liquid nitrogen: Yes   Region frozen until ice ball extended beyond lesion: Yes   Outcome: patient tolerated procedure well with no complications   Post-procedure details: wound care instructions given    Lichen planus bil arms Start Lidex cr bid to arms until clear May consider bx in future  Lichen planus tends to be a chronic recurrent itchy rash that can be flared by stress and is often located in the volar aspects of the extremities but can be generalized also.  Topical steroids (such as triamcinolone, fluocinolone, fluocinonide, mometasone, clobetasol, halobetasol, betamethasone, hydrocortisone) can cause thinning and lightening of the skin if they are used for too long in the same area. Your physician has selected the right strength medicine for your problem and area affected on the body. Please use your medication only as directed by your physician to prevent side effects.    fluocinonide cream (LIDEX) 0.05 % - bil arms Apply 1 application topically 2 (two) times daily. Bid to arms until clear, avoid face, groin, axilla  Skin cancer screening  Lentigines -  Scattered tan macules - Due to sun exposure - Benign-appearing, observe - Recommend daily broad spectrum sunscreen SPF 30+ to sun-exposed areas, reapply every 2 hours as needed. - Call for any changes  Seborrheic Keratoses - Stuck-on, waxy, tan-brown papules and/or plaques  - Benign-appearing - Discussed benign etiology and prognosis. - Observe - Call for any changes  Melanocytic  Nevi - Tan-brown and/or pink-flesh-colored symmetric macules and papules - Benign appearing on exam today - Observation - Call clinic for new or changing moles - Recommend daily use of broad spectrum spf 30+ sunscreen to sun-exposed areas.   Hemangiomas - Red papules - Discussed benign nature - Observe - Call for any changes  Actinic Damage - Chronic condition, secondary to cumulative UV/sun exposure - diffuse scaly erythematous macules with underlying dyspigmentation - Recommend daily broad spectrum sunscreen SPF 30+ to sun-exposed areas, reapply every 2 hours as needed.  - Staying in the shade or wearing long sleeves, sun glasses (UVA+UVB protection) and wide brim hats (4-inch brim around the entire circumference of the hat) are also recommended for sun protection.  - Call for new or changing lesions.  Skin cancer screening performed today.  Return in about 3 months (around 08/18/252) for Lichen planus, Ak f/u.  I, Othelia Pulling, RMA, am acting as scribe for Sarina Ser, MD . Documentation: I have reviewed the above documentation for accuracy and completeness, and I agree with the above.  Sarina Ser, MD

## 2021-04-14 NOTE — Patient Instructions (Signed)

## 2021-04-18 ENCOUNTER — Encounter: Payer: Self-pay | Admitting: Dermatology

## 2021-04-26 ENCOUNTER — Other Ambulatory Visit: Payer: Self-pay

## 2021-04-26 ENCOUNTER — Ambulatory Visit
Admission: RE | Admit: 2021-04-26 | Discharge: 2021-04-26 | Disposition: A | Discharge status: Home / Self Care | Payer: Medicare Other | Source: Ambulatory Visit | Attending: Internal Medicine | Admitting: Internal Medicine

## 2021-04-26 DIAGNOSIS — I2510 Atherosclerotic heart disease of native coronary artery without angina pectoris: Secondary | ICD-10-CM | POA: Diagnosis not present

## 2021-04-26 DIAGNOSIS — R911 Solitary pulmonary nodule: Secondary | ICD-10-CM | POA: Insufficient documentation

## 2021-04-26 DIAGNOSIS — I700 Atherosclerosis of aorta: Secondary | ICD-10-CM | POA: Insufficient documentation

## 2021-04-26 DIAGNOSIS — Z122 Encounter for screening for malignant neoplasm of respiratory organs: Secondary | ICD-10-CM | POA: Diagnosis not present

## 2021-04-26 DIAGNOSIS — J432 Centrilobular emphysema: Secondary | ICD-10-CM | POA: Insufficient documentation

## 2021-04-26 DIAGNOSIS — Z87891 Personal history of nicotine dependence: Secondary | ICD-10-CM | POA: Insufficient documentation

## 2021-06-23 ENCOUNTER — Other Ambulatory Visit: Payer: Self-pay | Admitting: *Deleted

## 2021-06-23 DIAGNOSIS — Z87891 Personal history of nicotine dependence: Secondary | ICD-10-CM

## 2021-08-04 ENCOUNTER — Ambulatory Visit: Payer: Medicare Other | Admitting: Dermatology

## 2021-08-04 ENCOUNTER — Other Ambulatory Visit: Payer: Self-pay

## 2021-08-04 DIAGNOSIS — D692 Other nonthrombocytopenic purpura: Secondary | ICD-10-CM

## 2021-08-04 DIAGNOSIS — Z872 Personal history of diseases of the skin and subcutaneous tissue: Secondary | ICD-10-CM

## 2021-08-04 DIAGNOSIS — L2089 Other atopic dermatitis: Secondary | ICD-10-CM | POA: Diagnosis not present

## 2021-08-04 DIAGNOSIS — L439 Lichen planus, unspecified: Secondary | ICD-10-CM | POA: Diagnosis not present

## 2021-08-04 DIAGNOSIS — L578 Other skin changes due to chronic exposure to nonionizing radiation: Secondary | ICD-10-CM

## 2021-08-04 MED ORDER — OPZELURA 1.5 % EX CREA: 1.0000 "application " | TOPICAL_CREAM | Freq: Every day | CUTANEOUS | 3 refills | Status: DC

## 2021-08-04 NOTE — Patient Instructions (Signed)

## 2021-08-04 NOTE — Progress Notes (Signed)
? ?Follow-Up Visit ?  ?Subjective  ?Bryan Navarro is a 76 y.o. male who presents for the following: Actinic Keratosis (Of the face and scalp - patient is here today for recheck of new or persistent skin lesions.), Lichen planus (Of the B/L arms improved with Lidex cream, although $200 per prescription. ), and Rash (On the legs with history of rash scattered on the body. He has used Clobetasol mixed in CeraVe in the past and that seems to help. He would like a refill of the Clobetasol today since he is out. ). ? ?The following portions of the chart were reviewed this encounter and updated as appropriate:  ? Tobacco  Allergies  Meds  Problems  Med Hx  Surg Hx  Fam Hx   ?  ?Review of Systems:  No other skin or systemic complaints except as noted in HPI or Assessment and Plan. ? ?Objective  ?Well appearing patient in no apparent distress; mood and affect are within normal limits. ? ?A focused examination was performed including the face, scalp, and arms. Relevant physical exam findings are noted in the Assessment and Plan. ? ?Face and scalp ?Clear.  ? ? ?Assessment & Plan  ?Lichen planus ?Chronic and persistent condition with duration or expected duration over one year. Condition is symptomatic/ bothersome to patient. Not currently at goal. ?B/L arms ?Start trial of Opzelura cream to aa's QD to reduce long term s/e associated with topical steroids.  ?Topical steroids (such as triamcinolone, fluocinolone, fluocinonide, mometasone, clobetasol, halobetasol, betamethasone, hydrocortisone) can cause thinning and lightening of the skin if they are used for too long in the same area. Your physician has selected the right strength medicine for your problem and area affected on the body. Please use your medication only as directed by your physician to prevent side effects.  ? ?Other atopic dermatitis ?Chronic and persistent condition with duration or expected duration over one year. Condition is symptomatic/ bothersome to  patient. Not currently at goal. ?B/L leg ?Atopic dermatitis (eczema) is a chronic, relapsing, pruritic condition that can significantly affect quality of life. It is often associated with allergic rhinitis and/or asthma and can require treatment with topical medications, phototherapy, or in severe cases biologic injectable medication (Dupixent; Adbry) or Oral JAK inhibitors. ? ?Start Opzelura daily for eczema/atopic dermatitis ?Samples given of Opzelura. Apply to aa's QD PRN.   ?                                             ?Change due to potential long-term s/e of topical steroid/clobetasol. Topical steroids (such as triamcinolone, fluocinolone, fluocinonide, mometasone, clobetasol, halobetasol, betamethasone, hydrocortisone) can cause thinning and lightening of the skin if they are used for too long in the same area. Your physician has selected the right strength medicine for your problem and area affected on the body. Please use your medication only as directed by your physician to prevent side effects.  ? ?Ruxolitinib Phosphate (OPZELURA) 1.5 % CREA - B/L leg ?Apply 1 application. topically daily. Apply to rash on the arms and legs QD PRN. ? ?History of actinic keratosis ?Face and scalp ?Clear. Observe for recurrence. Call clinic for new or changing lesions.  Recommend regular skin exams, daily broad-spectrum spf 30+ sunscreen use, and photoprotection.   ? ?Purpura - Chronic; persistent and recurrent.  Treatable, but not curable. ?- Violaceous macules and patches ?- Benign ?-  Related to trauma, age, sun damage and/or use of blood thinners, chronic use of topical and/or oral steroids ?- Observe ?- Can use OTC arnica containing moisturizer such as Dermend Bruise Formula if desired ?- Call for worsening or other concerns ? ?Actinic Damage ?- chronic, secondary to cumulative UV radiation exposure/sun exposure over time ?- diffuse scaly erythematous macules with underlying dyspigmentation ?- Recommend daily broad  spectrum sunscreen SPF 30+ to sun-exposed areas, reapply every 2 hours as needed.  ?- Recommend staying in the shade or wearing long sleeves, sun glasses (UVA+UVB protection) and wide brim hats (4-inch brim around the entire circumference of the hat). ?- Call for new or changing lesions. ? ?Return in about 6 months (around 07/08/6379) for Ak, AD, lichen planus. ? ?I, Rudell Cobb, CMA, am acting as scribe for Sarina Ser, MD . ?Documentation: I have reviewed the above documentation for accuracy and completeness, and I agree with the above. ? ?Sarina Ser, MD ? ?

## 2021-08-05 ENCOUNTER — Encounter: Payer: Self-pay | Admitting: Dermatology

## 2021-08-05 DIAGNOSIS — L439 Lichen planus, unspecified: Secondary | ICD-10-CM

## 2021-08-05 MED ORDER — MOMETASONE FUROATE 0.1 % EX CREA: TOPICAL_CREAM | CUTANEOUS | 3 refills | Status: DC

## 2021-08-08 ENCOUNTER — Encounter: Payer: Self-pay | Admitting: Dermatology

## 2022-02-10 ENCOUNTER — Ambulatory Visit: Payer: Medicare Other | Admitting: Dermatology

## 2022-02-16 ENCOUNTER — Ambulatory Visit: Payer: Medicare Other | Admitting: Dermatology

## 2022-02-16 DIAGNOSIS — L439 Lichen planus, unspecified: Secondary | ICD-10-CM

## 2022-02-16 DIAGNOSIS — L2089 Other atopic dermatitis: Secondary | ICD-10-CM | POA: Diagnosis not present

## 2022-02-16 NOTE — Progress Notes (Unsigned)
   Follow-Up Visit   Subjective  Bryan Navarro is a 76 y.o. male who presents for the following: Follow-up (Lichen planus of arms - improved - Mometasone cream prn/Atopic Derm of legs - improved - Mometasone cream prn).  The following portions of the chart were reviewed this encounter and updated as appropriate:   Tobacco  Allergies  Meds  Problems  Med Hx  Surg Hx  Fam Hx     Review of Systems:  No other skin or systemic complaints except as noted in HPI or Assessment and Plan.  Objective  Well appearing patient in no apparent distress; mood and affect are within normal limits.  A focused examination was performed including arms, legs. Relevant physical exam findings are noted in the Assessment and Plan.  Arms Clear today  Legs Clear today   Assessment & Plan  Lichen planus Arms Improved. Restart Mometasone cream with any flares Discussed potential for recurrence.  Related Medications mometasone (ELOCON) 0.1 % cream Apply to areas of rash QD up to 5d/wk.  Other atopic dermatitis Legs Improved. Restart Mometasone cream with any flares Atopic dermatitis (eczema) is a chronic, relapsing, pruritic condition that can significantly affect quality of life. It is often associated with allergic rhinitis and/or asthma and can require treatment with topical medications, phototherapy, or in severe cases biologic injectable medication (Dupixent; Adbry) or Oral JAK inhibitors.  Return in about 1 year (around 02/17/2023).  I, Ashok Cordia, CMA, am acting as scribe for Sarina Ser, MD . Documentation: I have reviewed the above documentation for accuracy and completeness, and I agree with the above.  Sarina Ser, MD

## 2022-02-16 NOTE — Patient Instructions (Signed)
Due to recent changes in healthcare laws, you may see results of your pathology and/or laboratory studies on MyChart before the doctors have had a chance to review them. We understand that in some cases there may be results that are confusing or concerning to you. Please understand that not all results are received at the same time and often the doctors may need to interpret multiple results in order to provide you with the best plan of care or course of treatment. Therefore, we ask that you please give us 2 business days to thoroughly review all your results before contacting the office for clarification. Should we see a critical lab result, you will be contacted sooner.   If You Need Anything After Your Visit  If you have any questions or concerns for your doctor, please call our main line at [phone] and press option 4 to reach your doctor's medical assistant. If no one answers, please leave a voicemail as directed and we will return your call as soon as possible. Messages left after 4 pm will be answered the following business day.   You may also send us a message via MyChart. We typically respond to MyChart messages within 1-2 business days.  For prescription refills, please ask your pharmacy to contact our office. Our fax number is [phone].  If you have an urgent issue when the clinic is closed that cannot wait until the next business day, you can page your doctor at the number below.    Please note that while we do our best to be available for urgent issues outside of office hours, we are not available 24/7.   If you have an urgent issue and are unable to reach us, you may choose to seek medical care at your doctor's office, retail clinic, urgent care center, or emergency room.  If you have a medical emergency, please immediately call 911 or go to the emergency department.  Pager Numbers  - Dr. Kowalski: [phone]  - Dr. Moye: [phone]  - Dr. Stewart:  [phone]  In the event of inclement weather, please call our main line at [phone] for an update on the status of any delays or closures.  Dermatology Medication Tips: Please keep the boxes that topical medications come in in order to help keep track of the instructions about where and how to use these. Pharmacies typically print the medication instructions only on the boxes and not directly on the medication tubes.   If your medication is too expensive, please contact our office at [phone] option 4 or send us a message through MyChart.   We are unable to tell what your co-pay for medications will be in advance as this is different depending on your insurance coverage. However, we may be able to find a substitute medication at lower cost or fill out paperwork to get insurance to cover a needed medication.   If a prior authorization is required to get your medication covered by your insurance company, please allow us 1-2 business days to complete this process.  Drug prices often vary depending on where the prescription is filled and some pharmacies may offer cheaper prices.  The website www.goodrx.com contains coupons for medications through different pharmacies. The prices here do not account for what the cost may be with help from insurance (it may be cheaper with your insurance), but the website can give you the price if you did not use any insurance.  - You can print the associated coupon and take it with   your prescription to the pharmacy.  - You may also stop by our office during regular business hours and pick up a GoodRx coupon card.  - If you need your prescription sent electronically to a different pharmacy, notify our office through [facility] MyChart or by phone at [phone] option 4.     Si Usted Necesita Algo Despus de Su Visita  Tambin puede enviarnos un mensaje a travs de MyChart. Por lo general respondemos a los mensajes de MyChart en el transcurso de 1 a 2  das hbiles.  Para renovar recetas, por favor pida a su farmacia que se ponga en contacto con nuestra oficina. Nuestro nmero de fax es el [phone].  Si tiene un asunto urgente cuando la clnica est cerrada y que no puede esperar hasta el siguiente da hbil, puede llamar/localizar a su doctor(a) al nmero que aparece a continuacin.   Por favor, tenga en cuenta que aunque hacemos todo lo posible para estar disponibles para asuntos urgentes fuera del horario de oficina, no estamos disponibles las 24 horas del da, los 7 das de la semana.   Si tiene un problema urgente y no puede comunicarse con nosotros, puede optar por buscar atencin mdica  en el consultorio de su doctor(a), en una clnica privada, en un centro de atencin urgente o en una sala de emergencias.  Si tiene una emergencia mdica, por favor llame inmediatamente al 911 o vaya a la sala de emergencias.  Nmeros de bper  - Dr. Kowalski: [phone]  - Dra. Moye: [phone]  - Dra. Stewart: [phone]  En caso de inclemencias del tiempo, por favor llame a nuestra lnea principal al [phone] para una actualizacin sobre el estado de cualquier retraso o cierre.  Consejos para la medicacin en dermatologa: Por favor, guarde las cajas en las que vienen los medicamentos de uso tpico para ayudarle a seguir las instrucciones sobre dnde y cmo usarlos. Las farmacias generalmente imprimen las instrucciones del medicamento slo en las cajas y no directamente en los tubos del medicamento.   Si su medicamento es muy caro, por favor, pngase en contacto con nuestra oficina llamando al [phone] y presione la opcin 4 o envenos un mensaje a travs de MyChart.   No podemos decirle cul ser su copago por los medicamentos por adelantado ya que esto es diferente dependiendo de la cobertura de su seguro. Sin embargo, es posible que podamos encontrar un medicamento sustituto a menor costo o llenar un formulario para que el  seguro cubra el medicamento que se considera necesario.   Si se requiere una autorizacin previa para que su compaa de seguros cubra su medicamento, por favor permtanos de 1 a 2 das hbiles para completar este proceso.  Los precios de los medicamentos varan con frecuencia dependiendo del lugar de dnde se surte la receta y alguna farmacias pueden ofrecer precios ms baratos.  El sitio web www.goodrx.com tiene cupones para medicamentos de diferentes farmacias. Los precios aqu no tienen en cuenta lo que podra costar con la ayuda del seguro (puede ser ms barato con su seguro), pero el sitio web puede darle el precio si no utiliz ningn seguro.  - Puede imprimir el cupn correspondiente y llevarlo con su receta a la farmacia.  - Tambin puede pasar por nuestra oficina durante el horario de atencin regular y recoger una tarjeta de cupones de GoodRx.  - Si necesita que su receta se enve electrnicamente a una farmacia diferente, informe a nuestra oficina a travs de MyChart de [place]   o por telfono llamando al [phone] y presione la opcin 4.  

## 2022-02-17 ENCOUNTER — Encounter: Payer: Self-pay | Admitting: Dermatology

## 2022-04-26 ENCOUNTER — Ambulatory Visit
Admission: RE | Admit: 2022-04-26 | Discharge: 2022-04-26 | Disposition: A | Discharge status: Home / Self Care | Payer: Medicare Other | Source: Ambulatory Visit | Attending: Acute Care | Admitting: Acute Care

## 2022-04-26 DIAGNOSIS — Z87891 Personal history of nicotine dependence: Secondary | ICD-10-CM | POA: Insufficient documentation

## 2022-09-28 ENCOUNTER — Other Ambulatory Visit: Payer: Self-pay | Admitting: Sports Medicine

## 2022-09-28 DIAGNOSIS — M47812 Spondylosis without myelopathy or radiculopathy, cervical region: Secondary | ICD-10-CM

## 2022-09-28 DIAGNOSIS — M50322 Other cervical disc degeneration at C5-C6 level: Secondary | ICD-10-CM

## 2022-09-28 DIAGNOSIS — M542 Cervicalgia: Secondary | ICD-10-CM

## 2022-10-15 ENCOUNTER — Ambulatory Visit
Admission: RE | Admit: 2022-10-15 | Discharge: 2022-10-15 | Disposition: A | Discharge status: Home / Self Care | Payer: Medicare HMO | Source: Ambulatory Visit | Attending: Sports Medicine | Admitting: Sports Medicine

## 2022-10-15 DIAGNOSIS — M47812 Spondylosis without myelopathy or radiculopathy, cervical region: Secondary | ICD-10-CM

## 2022-10-15 DIAGNOSIS — M542 Cervicalgia: Secondary | ICD-10-CM

## 2022-10-15 DIAGNOSIS — M50322 Other cervical disc degeneration at C5-C6 level: Secondary | ICD-10-CM

## 2022-10-27 ENCOUNTER — Other Ambulatory Visit: Payer: Self-pay | Admitting: Family Medicine

## 2022-10-27 DIAGNOSIS — M5412 Radiculopathy, cervical region: Secondary | ICD-10-CM

## 2023-01-04 DIAGNOSIS — E1165 Type 2 diabetes mellitus with hyperglycemia: Secondary | ICD-10-CM | POA: Insufficient documentation

## 2023-02-13 ENCOUNTER — Ambulatory Visit: Payer: Medicare HMO | Admitting: Dermatology

## 2023-02-13 ENCOUNTER — Ambulatory Visit: Payer: Medicare Other | Admitting: Dermatology

## 2023-02-13 ENCOUNTER — Encounter: Payer: Self-pay | Admitting: Dermatology

## 2023-02-13 DIAGNOSIS — W908XXA Exposure to other nonionizing radiation, initial encounter: Secondary | ICD-10-CM

## 2023-02-13 DIAGNOSIS — L814 Other melanin hyperpigmentation: Secondary | ICD-10-CM | POA: Diagnosis not present

## 2023-02-13 DIAGNOSIS — L720 Epidermal cyst: Secondary | ICD-10-CM

## 2023-02-13 DIAGNOSIS — Z1283 Encounter for screening for malignant neoplasm of skin: Secondary | ICD-10-CM | POA: Diagnosis not present

## 2023-02-13 DIAGNOSIS — L570 Actinic keratosis: Secondary | ICD-10-CM

## 2023-02-13 DIAGNOSIS — L578 Other skin changes due to chronic exposure to nonionizing radiation: Secondary | ICD-10-CM | POA: Diagnosis not present

## 2023-02-13 DIAGNOSIS — D492 Neoplasm of unspecified behavior of bone, soft tissue, and skin: Secondary | ICD-10-CM

## 2023-02-13 DIAGNOSIS — D485 Neoplasm of uncertain behavior of skin: Secondary | ICD-10-CM

## 2023-02-13 DIAGNOSIS — L820 Inflamed seborrheic keratosis: Secondary | ICD-10-CM

## 2023-02-13 DIAGNOSIS — D229 Melanocytic nevi, unspecified: Secondary | ICD-10-CM

## 2023-02-13 DIAGNOSIS — L821 Other seborrheic keratosis: Secondary | ICD-10-CM

## 2023-02-13 DIAGNOSIS — D1801 Hemangioma of skin and subcutaneous tissue: Secondary | ICD-10-CM

## 2023-02-13 NOTE — Patient Instructions (Addendum)
Cryotherapy Aftercare  Wash gently with soap and water everyday.   Apply Vaseline and Band-Aid daily until healed.   Wound Care Instructions  Cleanse wound gently with soap and water once a day then pat dry with clean gauze. Apply a thin coat of Petrolatum (petroleum jelly, "Vaseline") over the wound (unless you have an allergy to this). We recommend that you use a new, sterile tube of Vaseline. Do not pick or remove scabs. Do not remove the yellow or white "healing tissue" from the base of the wound.  Cover the wound with fresh, clean, nonstick gauze and secure with paper tape. You may use Band-Aids in place of gauze and tape if the wound is small enough, but would recommend trimming much of the tape off as there is often too much. Sometimes Band-Aids can irritate the skin.  You should call the office for your biopsy report after 1 week if you have not already been contacted.  If you experience any problems, such as abnormal amounts of bleeding, swelling, significant bruising, significant pain, or evidence of infection, please call the office immediately.  FOR ADULT SURGERY PATIENTS: If you need something for pain relief you may take 1 extra strength Tylenol (acetaminophen) AND 2 Ibuprofen (200mg  each) together every 4 hours as needed for pain. (do not take these if you are allergic to them or if you have a reason you should not take them.) Typically, you may only need pain medication for 1 to 3 days.   Melanoma ABCDEs  Melanoma is the most dangerous type of skin cancer, and is the leading cause of death from skin disease.  You are more likely to develop melanoma if you: Have light-colored skin, light-colored eyes, or red or blond hair Spend a lot of time in the sun Tan regularly, either outdoors or in a tanning bed Have had blistering sunburns, especially during childhood Have a close family member who has had a melanoma Have atypical moles or large birthmarks  Early detection of  melanoma is key since treatment is typically straightforward and cure rates are extremely high if we catch it early.   The first sign of melanoma is often a change in a mole or a new dark spot.  The ABCDE system is a way of remembering the signs of melanoma.  A for asymmetry:  The two halves do not match. B for border:  The edges of the growth are irregular. C for color:  A mixture of colors are present instead of an even brown color. D for diameter:  Melanomas are usually (but not always) greater than 6mm - the size of a pencil eraser. E for evolution:  The spot keeps changing in size, shape, and color.  Please check your skin once per month between visits. You can use a small mirror in front and a large mirror behind you to keep an eye on the back side or your body.   If you see any new or changing lesions before your next follow-up, please call to schedule a visit.  Please continue daily skin protection including broad spectrum sunscreen SPF 30+ to sun-exposed areas, reapplying every 2 hours as needed when you're outdoors.    Due to recent changes in healthcare laws, you may see results of your pathology and/or laboratory studies on MyChart before the doctors have had a chance to review them. We understand that in some cases there may be results that are confusing or concerning to you. Please understand that not all results  are received at the same time and often the doctors may need to interpret multiple results in order to provide you with the best plan of care or course of treatment. Therefore, we ask that you please give [place] 2 business days to thoroughly review all your results before contacting the office for clarification. Should we see a critical lab result, you will be contacted sooner.   If You Need Anything After Your Visit  If you have any questions or concerns for your doctor, please call our main line at [phone] and press option 4 to reach your doctor's medical assistant. If  no one answers, please leave a voicemail as directed and we will return your call as soon as possible. Messages left after 4 pm will be answered the following business day.   You may also send [place] a message via MyChart. We typically respond to MyChart messages within 1-2 business days.  For prescription refills, please ask your pharmacy to contact our office. Our fax number is [phone].  If you have an urgent issue when the clinic is closed that cannot wait until the next business day, you can page your doctor at the number below.    Please note that while we do our best to be available for urgent issues outside of office hours, we are not available 24/7.   If you have an urgent issue and are unable to reach [place], you may choose to seek medical care at your doctor's office, retail clinic, urgent care center, or emergency room.  If you have a medical emergency, please immediately call 911 or go to the emergency department.  Pager Numbers  - Dr. Gwen Pounds: [phone]  - Dr. Roseanne Reno: [phone]  - Dr. Katrinka Blazing: [phone]   In the event of inclement weather, please call our main line at [phone] for an update on the status of any delays or closures.  Dermatology Medication Tips: Please keep the boxes that topical medications come in in order to help keep track of the instructions about where and how to use these. Pharmacies typically print the medication instructions only on the boxes and not directly on the medication tubes.   If your medication is too expensive, please contact our office at [phone] option 4 or send [place] a message through MyChart.   We are unable to tell what your co-pay for medications will be in advance as this is different depending on your insurance coverage. However, we may be able to find a substitute medication at lower cost or fill out paperwork to get insurance to cover a needed medication.   If a prior authorization is required to get your medication  covered by your insurance company, please allow [place] 1-2 business days to complete this process.  Drug prices often vary depending on where the prescription is filled and some pharmacies may offer cheaper prices.  The website www.goodrx.com contains coupons for medications through different pharmacies. The prices here do not account for what the cost may be with help from insurance (it may be cheaper with your insurance), but the website can give you the price if you did not use any insurance.  - You can print the associated coupon and take it with your prescription to the pharmacy.  - You may also stop by our office during regular business hours and pick up a GoodRx coupon card.  - If you need your prescription sent electronically to a different pharmacy, notify our office through [facility] or by phone at [phone]  option 4.

## 2023-02-13 NOTE — Progress Notes (Signed)
Follow-Up Visit   Subjective  Bryan Navarro is a 77 y.o. male who presents for the following: Skin Cancer Screening and Full Body Skin Exam  The patient presents for Total-Body Skin Exam (TBSE) for skin cancer screening and mole check. The patient has spots, moles and lesions to be evaluated, some may be new or changing and the patient may have concern these could be cancer.  No hx of skin cancer. Spot at right temple, present for about 3 months, never heals. Rough spot at left temple/hairline.   The following portions of the chart were reviewed this encounter and updated as appropriate: medications, allergies, medical history  Review of Systems:  No other skin or systemic complaints except as noted in HPI or Assessment and Plan.  Objective  Well appearing patient in no apparent distress; mood and affect are within normal limits.  A full examination was performed including scalp, head, eyes, ears, nose, lips, neck, chest, axillae, abdomen, back, buttocks, bilateral upper extremities, bilateral lower extremities, hands, feet, fingers, toes, fingernails, and toenails. All findings within normal limits unless otherwise noted below.   Relevant physical exam findings are noted in the Assessment and Plan.  Right Temple Erythematous stuck-on, waxy papule or plaque  R sup helix x 2, R preauricular x 1, R temple x 1, nasal tip x 1 , L mid cheek x 1, L temple x 2, L mid helix x 1, central frontal scalp x 1, R sup forehead x 1, L sup forehead x 1 (12) Erythematous thin papules/macules with gritty scale.   Right infraorbital  Skin coloured papule with prominent ostium. Present and unchanging for 10 years per patient. Long hair was pulled out of it. Suspect trichofolliculoma or EIC      Left proximal palmar thumb Skin colored compressible papule, suspect mucous cyst vs neurofibroma      Left Chest 8 mm erythematous macule R/o BCC       Assessment & Plan   SKIN CANCER  SCREENING PERFORMED TODAY.  ACTINIC DAMAGE - Chronic condition, secondary to cumulative UV/sun exposure - diffuse scaly erythematous macules with underlying dyspigmentation - Recommend daily broad spectrum sunscreen SPF 30+ to sun-exposed areas, reapply every 2 hours as needed.  - Staying in the shade or wearing long sleeves, sun glasses (UVA+UVB protection) and wide brim hats (4-inch brim around the entire circumference of the hat) are also recommended for sun protection.  - Call for new or changing lesions.  LENTIGINES, SEBORRHEIC KERATOSES, HEMANGIOMAS - Benign normal skin lesions - Benign-appearing - Call for any changes  MELANOCYTIC NEVI - Tan-brown and/or pink-flesh-colored symmetric macules and papules - Benign appearing on exam today - Observation - Call clinic for new or changing moles - Recommend daily use of broad spectrum spf 30+ sunscreen to sun-exposed areas.   EPIDERMAL INCLUSION CYST Exam: Subcutaneous nodule at right lower back  Benign-appearing. Exam most consistent with an epidermal inclusion cyst. Discussed that a cyst is a benign growth that can grow over time and sometimes get irritated or inflamed. Recommend observation if it is not bothersome. Discussed option of surgical excision to remove it if it is growing, symptomatic, or other changes noted. Please call for new or changing lesions so they can be evaluated.       Inflamed seborrheic keratosis Right Temple  Symptomatic, irritating, patient would like treated.  Benign-appearing.  Call clinic for new or changing lesions.    Destruction of lesion - Right Temple  Destruction method: cryotherapy   Informed  consent: discussed and consent obtained   Lesion destroyed using liquid nitrogen: Yes   Cryotherapy cycles:  2 Outcome: patient tolerated procedure well with no complications   Post-procedure details: wound care instructions given    AK (actinic keratosis) (12) R sup helix x 2, R preauricular x  1, R temple x 1, nasal tip x 1 , L mid cheek x 1, L temple x 2, L mid helix x 1, central frontal scalp x 1, R sup forehead x 1, L sup forehead x 1  Actinic keratoses are precancerous spots that appear secondary to cumulative UV radiation exposure/sun exposure over time. They are chronic with expected duration over 1 year. A portion of actinic keratoses will progress to squamous cell carcinoma of the skin. It is not possible to reliably predict which spots will progress to skin cancer and so treatment is recommended to prevent development of skin cancer.  Recommend daily broad spectrum sunscreen SPF 30+ to sun-exposed areas, reapply every 2 hours as needed.  Recommend staying in the shade or wearing long sleeves, sun glasses (UVA+UVB protection) and wide brim hats (4-inch brim around the entire circumference of the hat). Call for new or changing lesions.   Destruction of lesion - R sup helix x 2, R preauricular x 1, R temple x 1, nasal tip x 1 , L mid cheek x 1, L temple x 2, L mid helix x 1, central frontal scalp x 1, R sup forehead x 1, L sup forehead x 1 (12)  Destruction method: cryotherapy   Informed consent: discussed and consent obtained   Lesion destroyed using liquid nitrogen: Yes   Cryotherapy cycles:  2 Outcome: patient tolerated procedure well with no complications   Post-procedure details: wound care instructions given    Neoplasm of uncertain behavior of skin (3) Right infraorbital  Left proximal palmar thumb  Left Chest  Skin / nail biopsy Type of biopsy: tangential   Informed consent: discussed and consent obtained   Timeout: patient name, date of birth, surgical site, and procedure verified   Procedure prep:  Patient was prepped and draped in usual sterile fashion Prep type:  Isopropyl alcohol Anesthesia: the lesion was anesthetized in a standard fashion   Anesthetic:  1% lidocaine w/ epinephrine 1-100,000 buffered w/ 8.4% NaHCO3 Instrument used: DermaBlade    Hemostasis achieved with: pressure, aluminum chloride and electrodesiccation   Outcome: patient tolerated procedure well   Post-procedure details: sterile dressing applied and wound care instructions given   Dressing type: bandage and petrolatum    Specimen 1 - Surgical pathology Differential Diagnosis: R/o BCC  Check Margins: No 8 mm erythematous macule   Favor benign overgrowth of hair follicle due to hx at right infraorbital Patient advises was ingrown hair > 10 years ago, sometimes can drain  Patient advises papule at left thumb present for years, not bothersome for patient.   Offered biopsy for both. Patient declines. Observe and biopsy if changing   Multiple benign nevi  Lentigines  Actinic elastosis  Seborrheic keratoses  Cherry angioma   Return in about 1 year (around 02/13/2024) for TBSE, Hx AK.  Anise Salvo, RMA, am acting as scribe for Elie Goody, MD .   Documentation: I have reviewed the above documentation for accuracy and completeness, and I agree with the above.  Elie Goody, MD

## 2023-02-23 ENCOUNTER — Ambulatory Visit: Payer: Medicare Other | Admitting: Dermatology

## 2023-04-05 IMAGING — CT CT CHEST LUNG CANCER SCREENING LOW DOSE W/O CM
2 of 5 series · 14 of 40 positions shown, 17 images · non-contrast
Comparison: Low-dose lung cancer screening chest CT 04/16/2020.

CLINICAL DATA: 75-year-old former smoker (quit 14 years ago) with
62 pack-year history of smoking. Lung cancer screening examination.

EXAM:
CT CHEST WITHOUT CONTRAST LOW-DOSE FOR LUNG CANCER SCREENING
TECHNIQUE: Multidetector CT imaging of the chest was performed following the
standard protocol without IV contrast.

[Series 3: lung 1.00 · axial · 0.68mm/px · z∈[-1241,-972]mm · 11 of 297 slices shown, 14 images]
[im 14/297  mediastinal]
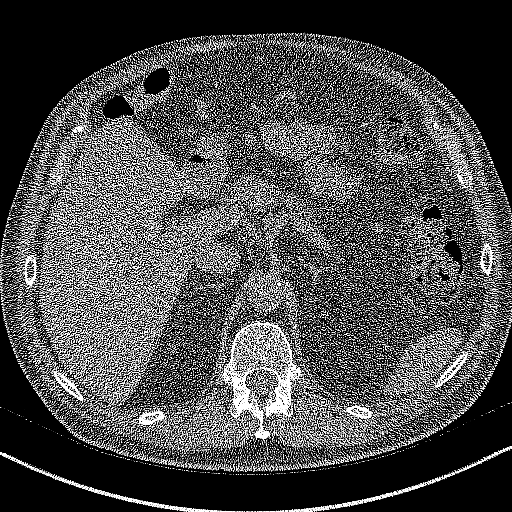
[im 14/297  lung]
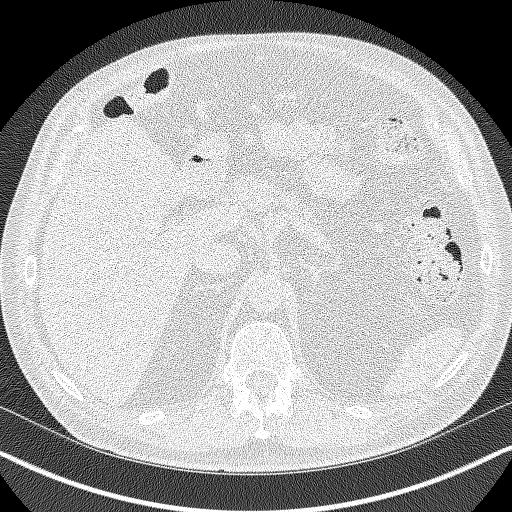
[im 41/297  lung]
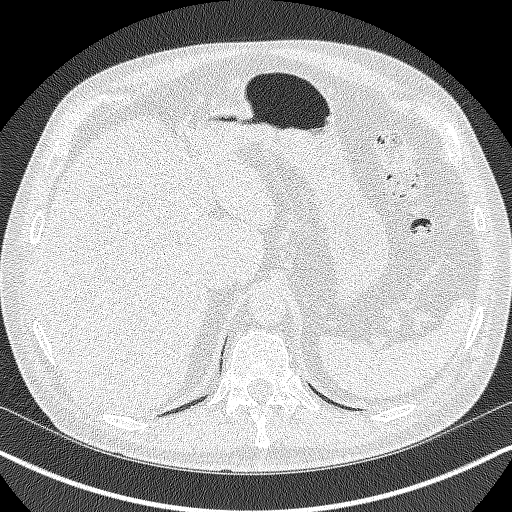
[im 68/297  lung]
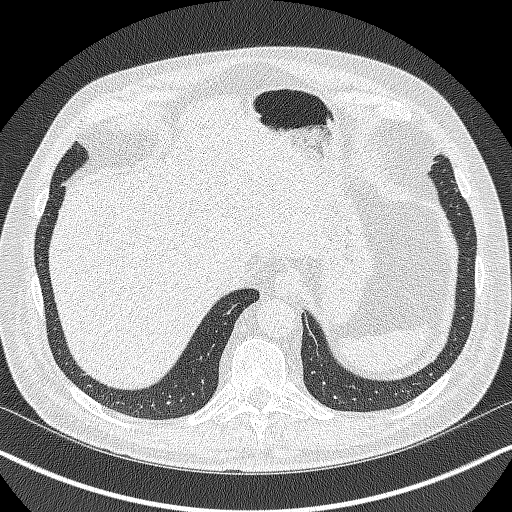
[im 95/297  lung]
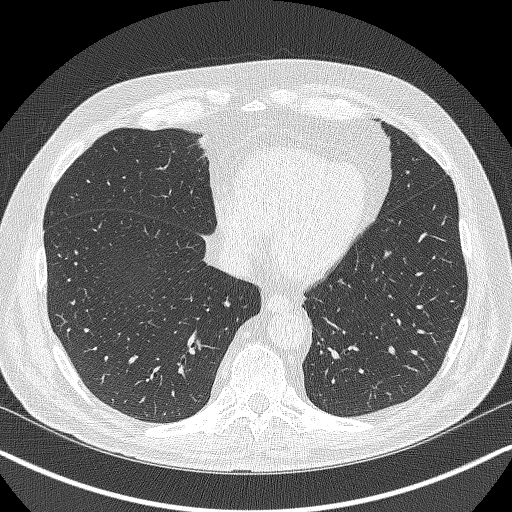
[im 122/297  mediastinal]
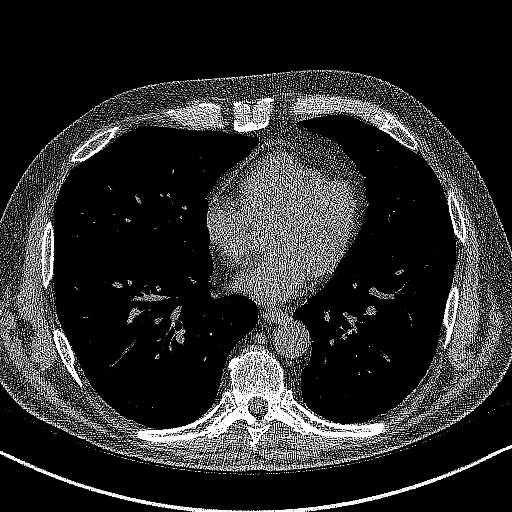
[im 122/297  lung]
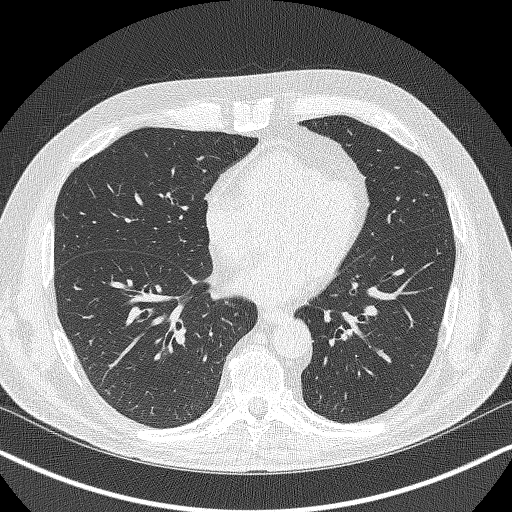
[im 149/297  lung]
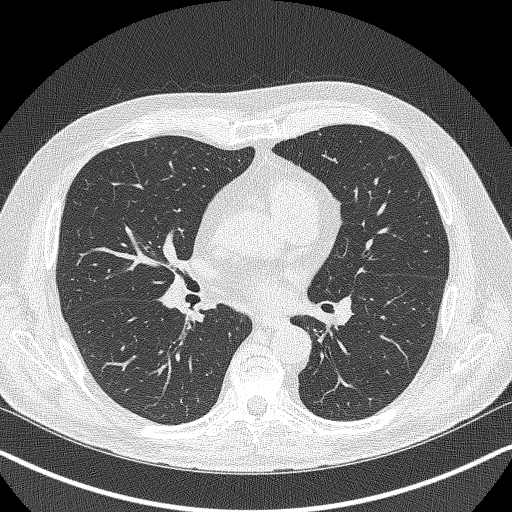
[im 175/297  lung]
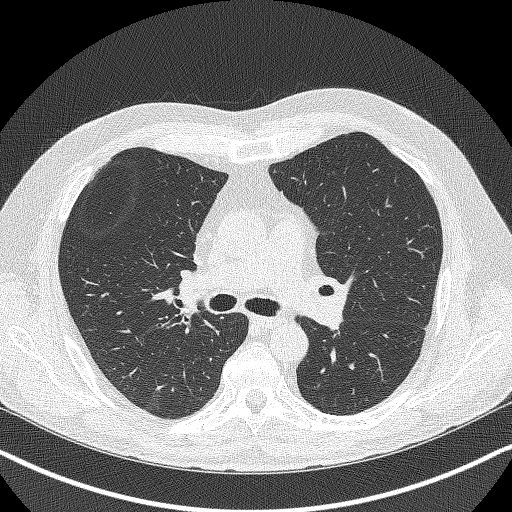
[im 202/297  lung]
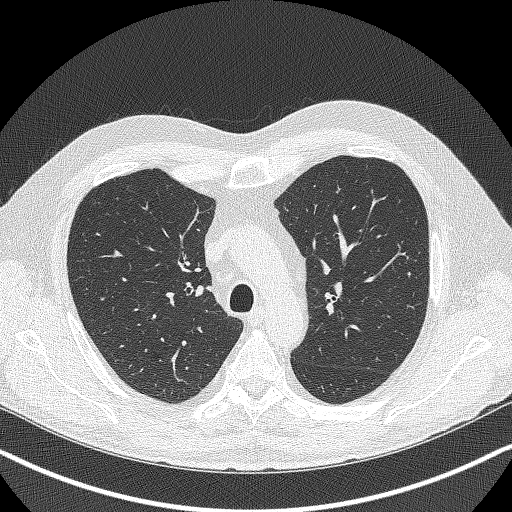
[im 229/297  mediastinal]
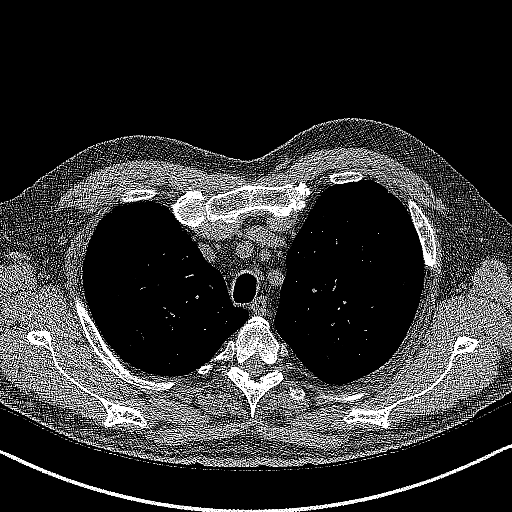
[im 229/297  lung]
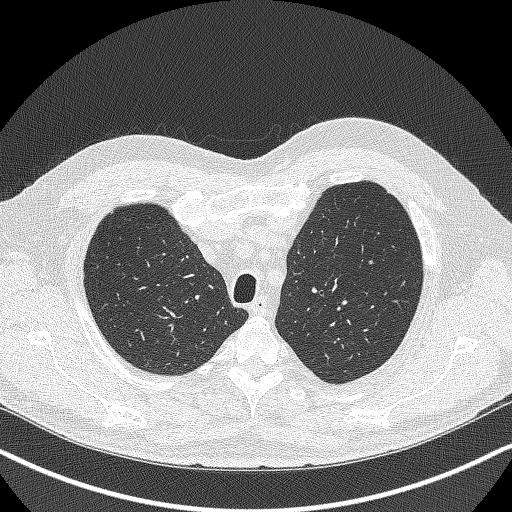
[im 256/297  lung]
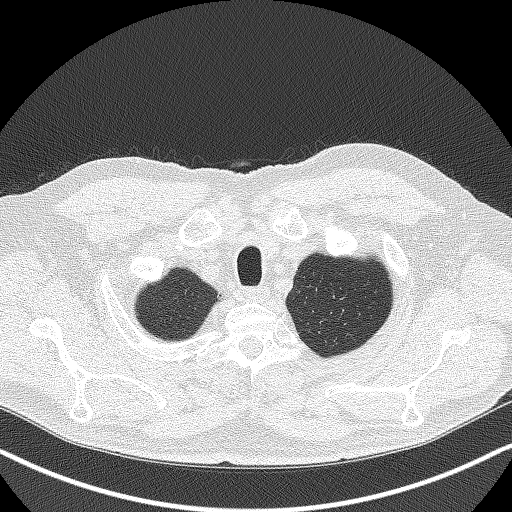
[im 283/297  lung]
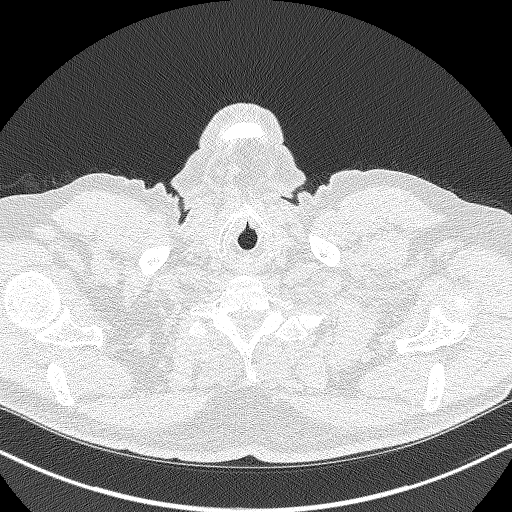

[Series 5: coronals lung 1.00 cor · coronal · 0.58mm/px · 3 of 291 slices shown]
[im 59/291  lung]
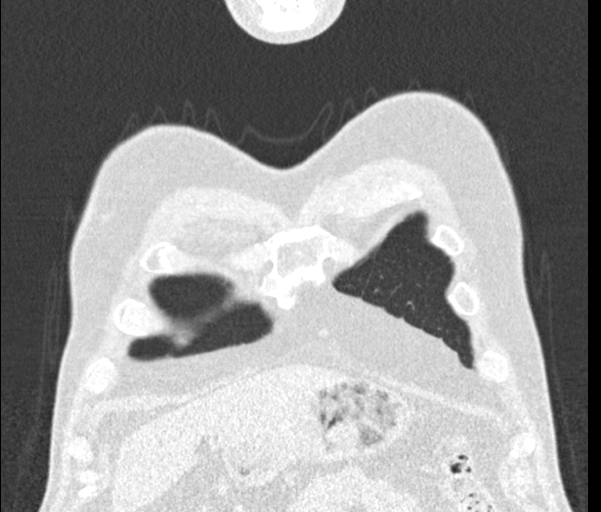
[im 117/291  lung]
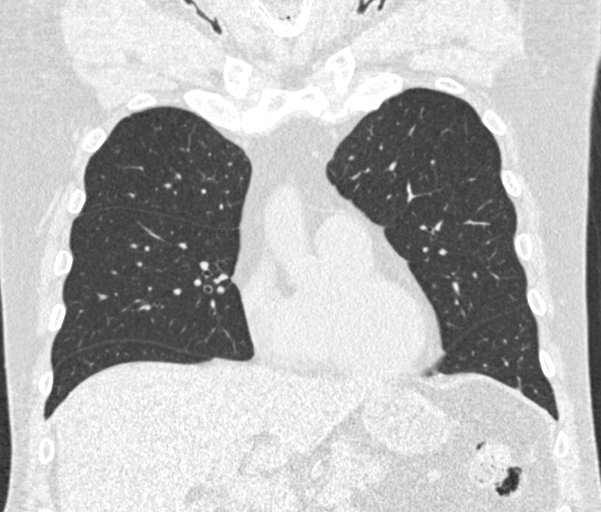
[im 175/291  lung]
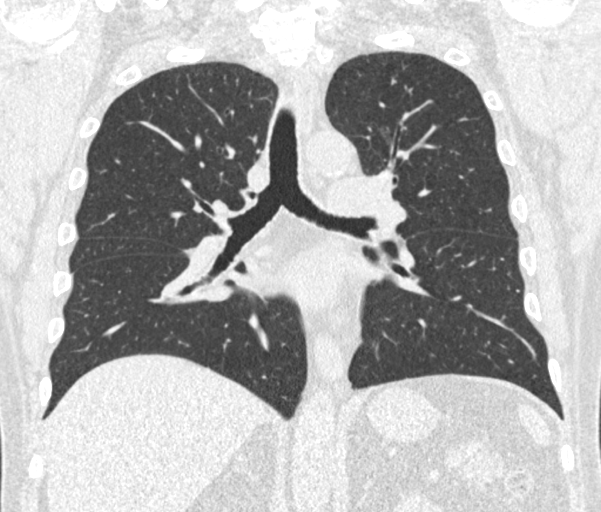

[14 of 40 positions shown; findings below may reference images not displayed]

FINDINGS: Cardiovascular: Heart size is normal. There is no significant
pericardial fluid, thickening or pericardial calcification. There is
aortic atherosclerosis, as well as atherosclerosis of the great
vessels of the mediastinum and the coronary arteries, including
calcified atherosclerotic plaque in the left anterior descending and
right coronary arteries.

Mediastinum/Nodes: No pathologically enlarged mediastinal or hilar
lymph nodes. Please note that accurate exclusion of hilar adenopathy
is limited on noncontrast CT scans. Esophagus is unremarkable in
appearance. No axillary lymphadenopathy.

Lungs/Pleura: Multiple small pulmonary nodules are again noted in
the lungs bilaterally, largest of which is in the periphery of the
left lower lobe (axial image 223 of series 3), with a volume derived
mean diameter of 3.0 mm. No larger more suspicious appearing
pulmonary nodules or masses are noted. No acute consolidative
airspace disease. No pleural effusions. Diffuse bronchial wall
thickening with very mild centrilobular and paraseptal emphysema.

Upper Abdomen: Aortic atherosclerosis.

Musculoskeletal: There are no aggressive appearing lytic or blastic
lesions noted in the visualized portions of the skeleton.
IMPRESSION: 1. Lung-RADS 2S, benign appearance or behavior. Continue annual
screening with low-dose chest CT without contrast in 12 months.
2. The "S" modifier above refers to potentially clinically
significant non lung cancer related findings. Specifically, there is
aortic atherosclerosis, in addition to 2 vessel coronary artery
disease. Please note that although the presence of coronary artery
calcium documents the presence of coronary artery disease, the
severity of this disease and any potential stenosis cannot be
assessed on this non-gated CT examination. Assessment for potential
risk factor modification, dietary therapy or pharmacologic therapy
may be warranted, if clinically indicated.
3. Mild diffuse bronchial wall thickening with very mild
centrilobular and paraseptal emphysema; imaging findings suggestive
of underlying COPD.

Aortic Atherosclerosis (4I3OH-JJK.K) and Emphysema (4I3OH-ZYR.I).

## 2023-11-16 DIAGNOSIS — I700 Atherosclerosis of aorta: Secondary | ICD-10-CM | POA: Insufficient documentation

## 2024-02-13 ENCOUNTER — Encounter: Payer: Self-pay | Admitting: Dermatology

## 2024-02-13 ENCOUNTER — Ambulatory Visit (INDEPENDENT_AMBULATORY_CARE_PROVIDER_SITE_OTHER): Payer: Medicare HMO | Admitting: Dermatology

## 2024-02-13 DIAGNOSIS — L738 Other specified follicular disorders: Secondary | ICD-10-CM

## 2024-02-13 DIAGNOSIS — I10 Essential (primary) hypertension: Secondary | ICD-10-CM | POA: Insufficient documentation

## 2024-02-13 DIAGNOSIS — L570 Actinic keratosis: Secondary | ICD-10-CM | POA: Diagnosis not present

## 2024-02-13 DIAGNOSIS — M51379 Other intervertebral disc degeneration, lumbosacral region without mention of lumbar back pain or lower extremity pain: Secondary | ICD-10-CM | POA: Insufficient documentation

## 2024-02-13 DIAGNOSIS — L821 Other seborrheic keratosis: Secondary | ICD-10-CM

## 2024-02-13 DIAGNOSIS — Z1283 Encounter for screening for malignant neoplasm of skin: Secondary | ICD-10-CM

## 2024-02-13 DIAGNOSIS — N401 Enlarged prostate with lower urinary tract symptoms: Secondary | ICD-10-CM | POA: Insufficient documentation

## 2024-02-13 DIAGNOSIS — L814 Other melanin hyperpigmentation: Secondary | ICD-10-CM

## 2024-02-13 DIAGNOSIS — M1990 Unspecified osteoarthritis, unspecified site: Secondary | ICD-10-CM | POA: Insufficient documentation

## 2024-02-13 DIAGNOSIS — D1801 Hemangioma of skin and subcutaneous tissue: Secondary | ICD-10-CM

## 2024-02-13 DIAGNOSIS — D239 Other benign neoplasm of skin, unspecified: Secondary | ICD-10-CM

## 2024-02-13 DIAGNOSIS — K269 Duodenal ulcer, unspecified as acute or chronic, without hemorrhage or perforation: Secondary | ICD-10-CM | POA: Insufficient documentation

## 2024-02-13 DIAGNOSIS — W908XXA Exposure to other nonionizing radiation, initial encounter: Secondary | ICD-10-CM | POA: Diagnosis not present

## 2024-02-13 DIAGNOSIS — L578 Other skin changes due to chronic exposure to nonionizing radiation: Secondary | ICD-10-CM | POA: Diagnosis not present

## 2024-02-13 DIAGNOSIS — D229 Melanocytic nevi, unspecified: Secondary | ICD-10-CM

## 2024-02-13 DIAGNOSIS — J439 Emphysema, unspecified: Secondary | ICD-10-CM | POA: Insufficient documentation

## 2024-02-13 NOTE — Patient Instructions (Signed)

## 2024-02-13 NOTE — Progress Notes (Signed)
 Follow-Up Visit   Subjective  Zell LABOR Bryan Navarro is a 78 y.o. male who presents for the following: Skin Cancer Screening and Full Body Skin Exam  The patient presents for Total-Body Skin Exam (TBSE) for skin cancer screening and mole check. The patient has spots, moles and lesions to be evaluated, some may be new or changing and the patient may have concern these could be cancer.  Hx AK. Spot at left calf, feels rough.   The following portions of the chart were reviewed this encounter and updated as appropriate: medications, allergies, medical history  Review of Systems:  No other skin or systemic complaints except as noted in HPI or Assessment and Plan.  Objective  Well appearing patient in no apparent distress; mood and affect are within normal limits.  A full examination was performed including scalp, head, eyes, ears, nose, lips, neck, chest, axillae, abdomen, back, buttocks, bilateral upper extremities, bilateral lower extremities, hands, feet, fingers, toes, fingernails, and toenails. All findings within normal limits unless otherwise noted below.   Relevant physical exam findings are noted in the Assessment and Plan.  R forehead x 4, R frontal scalp x 3, L frontal scalp x 3, L forehead x 4, L temple x 1, L cheek x 2, L antihelix x 1, nasal dorsum x 1, R cheek x 2 (21) Erythematous thin papules/macules with gritty scale.   Assessment & Plan   SKIN CANCER SCREENING PERFORMED TODAY.  ACTINIC DAMAGE - Chronic condition, secondary to cumulative UV/sun exposure - diffuse scaly erythematous macules with underlying dyspigmentation - Recommend daily broad spectrum sunscreen SPF 30+ to sun-exposed areas, reapply every 2 hours as needed.  - Staying in the shade or wearing long sleeves, sun glasses (UVA+UVB protection) and wide brim hats (4-inch brim around the entire circumference of the hat) are also recommended for sun protection.  - Call for new or changing lesions.  LENTIGINES,  SEBORRHEIC KERATOSES, HEMANGIOMAS - Benign normal skin lesions - Benign-appearing - Call for any changes  MELANOCYTIC NEVI - Tan-brown and/or pink-flesh-colored symmetric macules and papules - Benign appearing on exam today - Observation - Call clinic for new or changing moles - Recommend daily use of broad spectrum spf 30+ sunscreen to sun-exposed areas.    AK (ACTINIC KERATOSIS) (21) R forehead x 4, R frontal scalp x 3, L frontal scalp x 3, L forehead x 4, L temple x 1, L cheek x 2, L antihelix x 1, nasal dorsum x 1, R cheek x 2 (21) Actinic keratoses are precancerous spots that appear secondary to cumulative UV radiation exposure/sun exposure over time. They are chronic with expected duration over 1 year. A portion of actinic keratoses will progress to squamous cell carcinoma of the skin. It is not possible to reliably predict which spots will progress to skin cancer and so treatment is recommended to prevent development of skin cancer.  Recommend daily broad spectrum sunscreen SPF 30+ to sun-exposed areas, reapply every 2 hours as needed.  Recommend staying in the shade or wearing long sleeves, sun glasses (UVA+UVB protection) and wide brim hats (4-inch brim around the entire circumference of the hat). Call for new or changing lesions.  Offered 1m AK follow up. Patient prefers to have it all done at 1 yr check Destruction of lesion - R forehead x 4, R frontal scalp x 3, L frontal scalp x 3, L forehead x 4, L temple x 1, L cheek x 2, L antihelix x 1, nasal dorsum x 1, R cheek  x 2 (21) Complexity: simple   Destruction method: cryotherapy   Informed consent: discussed and consent obtained   Timeout:  patient name, date of birth, surgical site, and procedure verified Lesion destroyed using liquid nitrogen: Yes   Region frozen until ice ball extended beyond lesion: Yes   Cryo cycles: 1 or 2. Outcome: patient tolerated procedure well with no complications   Post-procedure details: wound  care instructions given    MULTIPLE BENIGN NEVI   LENTIGINES   SEBORRHEIC KERATOSES   ACTINIC ELASTOSIS   DERMATOFIBROMA   CHERRY ANGIOMA   SEBACEOUS HYPERPLASIA   Return in about 1 year (around 02/12/2025) for TBSE, with Dr. Claudene, HxAK.  LILLETTE Lonell Drones, RMA, am acting as scribe for Boneta Claudene, MD .   Documentation: I have reviewed the above documentation for accuracy and completeness, and I agree with the above.  Boneta Claudene, MD

## 2025-02-13 ENCOUNTER — Ambulatory Visit: Admitting: Dermatology
